# Patient Record
Sex: Female | Born: 1960 | Race: White | Hispanic: No | Marital: Married
Health system: Southern US, Community
[De-identification: ages and names within clinical notes are randomized; demographics above are authoritative.]

## PROBLEM LIST (undated history)

## (undated) DIAGNOSIS — F1020 Alcohol dependence, uncomplicated: Secondary | ICD-10-CM

---

## 2004-10-11 ENCOUNTER — Ambulatory Visit: Payer: Self-pay | Admitting: Unknown Physician Specialty

## 2005-07-29 ENCOUNTER — Other Ambulatory Visit: Payer: Self-pay

## 2005-07-29 ENCOUNTER — Inpatient Hospital Stay: Payer: Self-pay | Admitting: Internal Medicine

## 2005-07-31 ENCOUNTER — Inpatient Hospital Stay: Payer: Self-pay | Admitting: Unknown Physician Specialty

## 2006-12-09 ENCOUNTER — Ambulatory Visit: Payer: Self-pay | Admitting: Unknown Physician Specialty

## 2008-08-20 ENCOUNTER — Inpatient Hospital Stay: Payer: Self-pay | Admitting: Internal Medicine

## 2008-11-09 ENCOUNTER — Ambulatory Visit: Payer: Self-pay | Admitting: Unknown Physician Specialty

## 2010-06-05 ENCOUNTER — Emergency Department: Payer: Self-pay | Admitting: Emergency Medicine

## 2010-06-15 ENCOUNTER — Ambulatory Visit: Payer: Self-pay

## 2012-03-24 ENCOUNTER — Emergency Department: Payer: Self-pay | Admitting: Emergency Medicine

## 2012-03-24 LAB — COMPREHENSIVE METABOLIC PANEL
Albumin: 3.7 g/dL (ref 3.4–5.0)
Alkaline Phosphatase: 122 U/L (ref 50–136)
Anion Gap: 10 (ref 7–16)
BUN: 9 mg/dL (ref 7–18)
Bilirubin,Total: 1.3 mg/dL — ABNORMAL HIGH (ref 0.2–1.0)
Calcium, Total: 9.4 mg/dL (ref 8.5–10.1)
Chloride: 104 mmol/L (ref 98–107)
Co2: 25 mmol/L (ref 21–32)
Creatinine: 0.58 mg/dL — ABNORMAL LOW (ref 0.60–1.30)
EGFR (African American): >60
EGFR (Non-African Amer.): >60
Glucose: 108 mg/dL — ABNORMAL HIGH (ref 65–99)
Osmolality: 277 (ref 275–301)
Potassium: 4.2 mmol/L (ref 3.5–5.1)
SGOT(AST): 159 U/L — ABNORMAL HIGH (ref 15–37)
SGPT (ALT): 151 U/L — ABNORMAL HIGH (ref 12–78)
Sodium: 139 mmol/L (ref 136–145)
Total Protein: 8.3 g/dL — ABNORMAL HIGH (ref 6.4–8.2)

## 2012-03-24 LAB — CBC
HCT: 44.9 %
HGB: 15.6 g/dL
MCH: 37.2 pg — ABNORMAL HIGH
MCHC: 34.8 g/dL
MCV: 107 fL — ABNORMAL HIGH
Platelet: 155 x10 3/mm 3
RBC: 4.2 X10 6/mm 3
RDW: 12.4 %
WBC: 7.2 x10 3/mm 3

## 2017-10-06 ENCOUNTER — Emergency Department: Payer: BC Managed Care – PPO

## 2017-10-06 ENCOUNTER — Inpatient Hospital Stay
Admission: EM | Admit: 2017-10-06 | Discharge: 2017-10-15 | DRG: 871 | Disposition: E | Payer: BC Managed Care – PPO | Attending: Internal Medicine | Admitting: Internal Medicine

## 2017-10-06 ENCOUNTER — Other Ambulatory Visit: Payer: Self-pay

## 2017-10-06 DIAGNOSIS — A419 Sepsis, unspecified organism: Secondary | ICD-10-CM | POA: Diagnosis present

## 2017-10-06 DIAGNOSIS — D7589 Other specified diseases of blood and blood-forming organs: Secondary | ICD-10-CM | POA: Diagnosis present

## 2017-10-06 DIAGNOSIS — R6521 Severe sepsis with septic shock: Secondary | ICD-10-CM | POA: Diagnosis present

## 2017-10-06 DIAGNOSIS — E872 Acidosis: Secondary | ICD-10-CM | POA: Diagnosis present

## 2017-10-06 DIAGNOSIS — I471 Supraventricular tachycardia: Secondary | ICD-10-CM | POA: Diagnosis present

## 2017-10-06 DIAGNOSIS — J80 Acute respiratory distress syndrome: Secondary | ICD-10-CM | POA: Diagnosis not present

## 2017-10-06 DIAGNOSIS — N170 Acute kidney failure with tubular necrosis: Secondary | ICD-10-CM | POA: Diagnosis present

## 2017-10-06 DIAGNOSIS — E162 Hypoglycemia, unspecified: Secondary | ICD-10-CM | POA: Diagnosis present

## 2017-10-06 DIAGNOSIS — Z7189 Other specified counseling: Secondary | ICD-10-CM | POA: Diagnosis not present

## 2017-10-06 DIAGNOSIS — F10239 Alcohol dependence with withdrawal, unspecified: Secondary | ICD-10-CM | POA: Diagnosis present

## 2017-10-06 DIAGNOSIS — R4182 Altered mental status, unspecified: Secondary | ICD-10-CM

## 2017-10-06 DIAGNOSIS — D684 Acquired coagulation factor deficiency: Secondary | ICD-10-CM | POA: Diagnosis present

## 2017-10-06 DIAGNOSIS — I248 Other forms of acute ischemic heart disease: Secondary | ICD-10-CM | POA: Diagnosis present

## 2017-10-06 DIAGNOSIS — J189 Pneumonia, unspecified organism: Secondary | ICD-10-CM | POA: Diagnosis not present

## 2017-10-06 DIAGNOSIS — R402431 Glasgow coma scale score 3-8, in the field [EMT or ambulance]: Secondary | ICD-10-CM | POA: Diagnosis not present

## 2017-10-06 DIAGNOSIS — D696 Thrombocytopenia, unspecified: Secondary | ICD-10-CM | POA: Diagnosis present

## 2017-10-06 DIAGNOSIS — A4101 Sepsis due to Methicillin susceptible Staphylococcus aureus: Principal | ICD-10-CM | POA: Diagnosis present

## 2017-10-06 DIAGNOSIS — J9601 Acute respiratory failure with hypoxia: Secondary | ICD-10-CM | POA: Diagnosis present

## 2017-10-06 DIAGNOSIS — K7040 Alcoholic hepatic failure without coma: Secondary | ICD-10-CM | POA: Diagnosis present

## 2017-10-06 DIAGNOSIS — K7290 Hepatic failure, unspecified without coma: Secondary | ICD-10-CM

## 2017-10-06 DIAGNOSIS — Z452 Encounter for adjustment and management of vascular access device: Secondary | ICD-10-CM

## 2017-10-06 DIAGNOSIS — R6520 Severe sepsis without septic shock: Secondary | ICD-10-CM

## 2017-10-06 DIAGNOSIS — R570 Cardiogenic shock: Secondary | ICD-10-CM | POA: Diagnosis present

## 2017-10-06 DIAGNOSIS — I878 Other specified disorders of veins: Secondary | ICD-10-CM | POA: Diagnosis present

## 2017-10-06 DIAGNOSIS — R739 Hyperglycemia, unspecified: Secondary | ICD-10-CM | POA: Diagnosis present

## 2017-10-06 DIAGNOSIS — D689 Coagulation defect, unspecified: Secondary | ICD-10-CM | POA: Diagnosis present

## 2017-10-06 DIAGNOSIS — F10939 Alcohol use, unspecified with withdrawal, unspecified: Secondary | ICD-10-CM | POA: Diagnosis present

## 2017-10-06 DIAGNOSIS — N390 Urinary tract infection, site not specified: Secondary | ICD-10-CM | POA: Diagnosis present

## 2017-10-06 DIAGNOSIS — L8990 Pressure ulcer of unspecified site, unspecified stage: Secondary | ICD-10-CM

## 2017-10-06 DIAGNOSIS — D539 Nutritional anemia, unspecified: Secondary | ICD-10-CM | POA: Diagnosis present

## 2017-10-06 DIAGNOSIS — J9600 Acute respiratory failure, unspecified whether with hypoxia or hypercapnia: Secondary | ICD-10-CM

## 2017-10-06 DIAGNOSIS — R001 Bradycardia, unspecified: Secondary | ICD-10-CM | POA: Diagnosis present

## 2017-10-06 DIAGNOSIS — Z66 Do not resuscitate: Secondary | ICD-10-CM | POA: Diagnosis not present

## 2017-10-06 DIAGNOSIS — R4020 Unspecified coma: Secondary | ICD-10-CM | POA: Diagnosis present

## 2017-10-06 DIAGNOSIS — R7881 Bacteremia: Secondary | ICD-10-CM | POA: Diagnosis not present

## 2017-10-06 DIAGNOSIS — R778 Other specified abnormalities of plasma proteins: Secondary | ICD-10-CM | POA: Diagnosis present

## 2017-10-06 DIAGNOSIS — J690 Pneumonitis due to inhalation of food and vomit: Secondary | ICD-10-CM | POA: Diagnosis present

## 2017-10-06 DIAGNOSIS — J9690 Respiratory failure, unspecified, unspecified whether with hypoxia or hypercapnia: Secondary | ICD-10-CM

## 2017-10-06 DIAGNOSIS — R7989 Other specified abnormal findings of blood chemistry: Secondary | ICD-10-CM | POA: Diagnosis present

## 2017-10-06 HISTORY — DX: Alcohol dependence, uncomplicated: F10.20

## 2017-10-06 LAB — PROTIME-INR
INR: 3.67
PROTHROMBIN TIME: 36.2 s — AB (ref 11.4–15.2)

## 2017-10-06 LAB — CBC WITH DIFFERENTIAL/PLATELET
Basophils Absolute: 0 10*3/uL (ref 0–0.1)
Basophils Relative: 0 %
EOS PCT: 0 %
Eosinophils Absolute: 0 10*3/uL (ref 0–0.7)
HCT: 38 % (ref 35.0–47.0)
Hemoglobin: 12.8 g/dL (ref 12.0–16.0)
LYMPHS ABS: 0.6 10*3/uL — AB (ref 1.0–3.6)
LYMPHS PCT: 5 %
MCH: 40.2 pg — AB (ref 26.0–34.0)
MCHC: 33.8 g/dL (ref 32.0–36.0)
MCV: 119 fL — AB (ref 80.0–100.0)
MONO ABS: 1.5 10*3/uL — AB (ref 0.2–0.9)
Monocytes Relative: 14 %
Neutro Abs: 9.2 10*3/uL — ABNORMAL HIGH (ref 1.4–6.5)
Neutrophils Relative %: 81 %
PLATELETS: 112 10*3/uL — AB (ref 150–440)
RBC: 3.19 MIL/uL — ABNORMAL LOW (ref 3.80–5.20)
RDW: 16.8 % — AB (ref 11.5–14.5)
WBC: 11.3 10*3/uL — ABNORMAL HIGH (ref 3.6–11.0)

## 2017-10-06 LAB — BLOOD GAS, VENOUS
ACID-BASE DEFICIT: 17.3 mmol/L — AB (ref 0.0–2.0)
BICARBONATE: 11.8 mmol/L — AB (ref 20.0–28.0)
FIO2: 50
LHR: 16 {breaths}/min
O2 Saturation: 63.6 %
PCO2 VEN: 39 mmHg — AB (ref 44.0–60.0)
PEEP: 5 cmH2O
Patient temperature: 37
VT: 450 mL
pH, Ven: 7.09 — CL (ref 7.250–7.430)
pO2, Ven: 47 mmHg — ABNORMAL HIGH (ref 32.0–45.0)

## 2017-10-06 LAB — LIPASE, BLOOD: LIPASE: 46 U/L (ref 11–51)

## 2017-10-06 LAB — LACTIC ACID, PLASMA
LACTIC ACID, VENOUS: 11.3 mmol/L — AB (ref 0.5–1.9)
LACTIC ACID, VENOUS: 8.9 mmol/L — AB (ref 0.5–1.9)

## 2017-10-06 LAB — COMPREHENSIVE METABOLIC PANEL
ALT: 69 U/L — ABNORMAL HIGH (ref 14–54)
AST: 208 U/L — ABNORMAL HIGH (ref 15–41)
Albumin: 2.4 g/dL — ABNORMAL LOW (ref 3.5–5.0)
Alkaline Phosphatase: 78 U/L (ref 38–126)
Anion gap: 20 — ABNORMAL HIGH (ref 5–15)
BUN: 34 mg/dL — ABNORMAL HIGH (ref 6–20)
CO2: 13 mmol/L — ABNORMAL LOW (ref 22–32)
CREATININE: 1.38 mg/dL — AB (ref 0.44–1.00)
Calcium: 8.2 mg/dL — ABNORMAL LOW (ref 8.9–10.3)
Chloride: 104 mmol/L (ref 101–111)
GFR, EST AFRICAN AMERICAN: 49 mL/min — AB (ref >60)
GFR, EST NON AFRICAN AMERICAN: 42 mL/min — AB (ref >60)
Glucose, Bld: 98 mg/dL (ref 65–99)
POTASSIUM: 3.7 mmol/L (ref 3.5–5.1)
Sodium: 137 mmol/L (ref 135–145)
Total Bilirubin: 10.7 mg/dL — ABNORMAL HIGH (ref 0.3–1.2)
Total Protein: 7.1 g/dL (ref 6.5–8.1)

## 2017-10-06 LAB — ACETAMINOPHEN LEVEL: Acetaminophen (Tylenol), Serum: <10 ug/mL — ABNORMAL LOW (ref 10–30)

## 2017-10-06 LAB — ETHANOL: Alcohol, Ethyl (B): <10 mg/dL (ref <10)

## 2017-10-06 LAB — SALICYLATE LEVEL: Salicylate Lvl: 9.8 mg/dL (ref 2.8–30.0)

## 2017-10-06 LAB — TROPONIN I: Troponin I: 0.45 ng/mL (ref <0.03)

## 2017-10-06 MED ORDER — MIDAZOLAM HCL 5 MG/5ML IJ SOLN
4.0000 mg | Freq: Once | INTRAMUSCULAR | Status: AC
  Administered 2017-10-06: 4 mg via INTRAVENOUS

## 2017-10-06 MED ORDER — SODIUM CHLORIDE 0.9 % IV BOLUS
1000.0000 mL | Freq: Once | INTRAVENOUS | Status: AC
  Administered 2017-10-06: 1000 mL via INTRAVENOUS

## 2017-10-06 MED ORDER — PROPOFOL 1000 MG/100ML IV EMUL
INTRAVENOUS | Status: AC
  Administered 2017-10-06: 5 ug/kg/min via INTRAVENOUS
  Filled 2017-10-06: qty 100

## 2017-10-06 MED ORDER — VANCOMYCIN HCL IN DEXTROSE 1-5 GM/200ML-% IV SOLN
1000.0000 mg | Freq: Once | INTRAVENOUS | Status: AC
  Administered 2017-10-06: 1000 mg via INTRAVENOUS
  Filled 2017-10-06: qty 200

## 2017-10-06 MED ORDER — SODIUM CHLORIDE 0.9 % IV SOLN
0.0000 ug/h | INTRAVENOUS | Status: DC
  Administered 2017-10-06: 25 ug/h via INTRAVENOUS

## 2017-10-06 MED ORDER — SUCCINYLCHOLINE CHLORIDE 20 MG/ML IJ SOLN
100.0000 mg | Freq: Once | INTRAMUSCULAR | Status: AC
  Administered 2017-10-06: 100 mg via INTRAVENOUS

## 2017-10-06 MED ORDER — PROPOFOL 10 MG/ML IV BOLUS
40.0000 mg | Freq: Once | INTRAVENOUS | Status: AC
  Administered 2017-10-06: 40 mg via INTRAVENOUS

## 2017-10-06 MED ORDER — FENTANYL 2500MCG IN NS 250ML (10MCG/ML) PREMIX INFUSION
0.0000 ug/h | INTRAVENOUS | Status: DC
Start: 2017-10-06 — End: 2017-10-07
  Filled 2017-10-06 (×2): qty 250

## 2017-10-06 MED ORDER — FENTANYL CITRATE (PF) 100 MCG/2ML IJ SOLN
100.0000 ug | Freq: Once | INTRAMUSCULAR | Status: AC
  Administered 2017-10-06: 100 ug via INTRAVENOUS

## 2017-10-06 MED ORDER — PIPERACILLIN-TAZOBACTAM 3.375 G IVPB 30 MIN
3.3750 g | Freq: Once | INTRAVENOUS | Status: AC
  Administered 2017-10-06: 3.375 g via INTRAVENOUS
  Filled 2017-10-06: qty 50

## 2017-10-06 MED ORDER — ETOMIDATE 2 MG/ML IV SOLN
20.0000 mg | Freq: Once | INTRAVENOUS | Status: AC
  Administered 2017-10-06: 20 mg via INTRAVENOUS

## 2017-10-06 MED ORDER — SODIUM CHLORIDE 0.9 % IV SOLN
8.0000 mg/h | INTRAVENOUS | Status: DC
  Administered 2017-10-07 – 2017-10-08 (×4): 8 mg/h via INTRAVENOUS
  Filled 2017-10-06 (×4): qty 80

## 2017-10-06 MED ORDER — PROPOFOL 1000 MG/100ML IV EMUL
5.0000 ug/kg/min | Freq: Once | INTRAVENOUS | Status: AC
  Administered 2017-10-06: 5 ug/kg/min via INTRAVENOUS

## 2017-10-06 MED ORDER — SODIUM CHLORIDE 0.9 % IV SOLN
1.0000 mg/h | INTRAVENOUS | Status: DC
  Administered 2017-10-06: 1 mg/h via INTRAVENOUS
  Filled 2017-10-06: qty 10

## 2017-10-06 MED ORDER — SODIUM CHLORIDE 0.9 % IV SOLN
80.0000 mg | Freq: Once | INTRAVENOUS | Status: AC
  Administered 2017-10-07: 80 mg via INTRAVENOUS
  Filled 2017-10-06: qty 80

## 2017-10-06 MED ORDER — PANTOPRAZOLE SODIUM 40 MG IV SOLR: 40.0000 mg | Freq: Two times a day (BID) | INTRAVENOUS | Status: DC

## 2017-10-06 NOTE — ED Triage Notes (Signed)
Pt arrived via EMS from home with complaints of unresponsiveness. Pt is responsive at this time. EMS was called by a friend. Pt was found face down on the floor at her home. Pt was cyanotic, snoring, and cold. EMS placed a nose trumpet. Pt has Hx of alcohol. No one was able to give a Hx for the pt. Pt pupils were dilated. EMS gave Narcan and an amp of D50. BS was 47 and went up to 120 after D50. EMS stated that it looks like she may have "track" marks on her arms but they are not sure exactly what it is. Pt CO2-11. Pt has not been eating or drinking . EMS placed 20 in left hand and 20 in right hand. Pt is covered in feces. Pt is very agitated. Dr. Marisa SeverinSiadecki at bedside
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
## 2017-10-06 NOTE — ED Notes (Signed)
Date and time results received: 09/20/2017 2105  (use smartphrase ".now" to insert current time)  Test: Troponin Critical Value: 0.45  Name of Provider Notified: Dr. Marisa SeverinSiadecki  Orders Received? Or Actions Taken?:

## 2017-10-06 NOTE — ED Notes (Signed)
Date and time results received: 10/14/2017 @ 23:33  Test: Lactic Acid Critical Value: 8.9 mmol/L  Name of Provider Notified: Dr Marisa SeverinSiadecki  Orders Received? Or Actions Taken?: No new orders; continue to monitor

## 2017-10-06 NOTE — ED Provider Notes (Signed)
[facility] Emergency Department Provider Note ____________________________________________   First MD Initiated Contact with Patient 10/09/2017 2008     (approximate)  I have reviewed the triage vital signs and the nursing notes.   HISTORY  Chief Complaint Loss of Consciousness  History of present illness severely limited due to altered mental status  HPI Whitney Olson is a 57 y.o. female with no known past medical history (except for possible history of alcohol abuse per EMS) who was found unresponsive on the floor at her home.  Unknown when last seen normal.  On EMS arrival, the patient was found facedown, she was cyanotic and snoring.  EMS placed a nasal trumpet, found her to be hypoglycemic, and gave D50.  Patient herself is unable to provide any history.     History reviewed. No pertinent past medical history.  There are no active problems to display for this patient.   History reviewed. No pertinent surgical history.  Prior to Admission medications   Not on File    Allergies Patient has no allergy information on record.  History reviewed. No pertinent family history.  Social History Social History   Tobacco Use  . Smoking status: Unknown If Ever Smoked  . Smokeless tobacco: Never Used  Substance Use Topics  . Alcohol use: Yes  . Drug use: Yes    Review of Systems Level 5 caveat: Unable to obtain review of systems due to altered mental status    ____________________________________________   PHYSICAL EXAM:  VITAL SIGNS: ED Triage Vitals  Enc Vitals Group     BP 09/16/2017 1950 (!) 185/171     Pulse Rate 09/17/2017 1950 (!) 191     Resp 09/19/2017 1950 (!) 23     Temp --      Temp src --      SpO2 10/04/2017 1950 100 %     Weight 09/20/2017 1955 170 lb (77.1 kg)     Height 10/07/2017 1955 5\' 6"  (1.676 m)     Head Circumference --      Peak Flow --      Pain Score --      Pain Loc --      Pain Edu? --      Excl. in GC? --      Constitutional: Somnolent; grimaces and moans to sternal rub or other painful stimuli.  Not following commands. Eyes: Conjunctivae are normal.  Postdilated.  EOMI.  PERRLA. Head: Atraumatic. Nose: No congestion/rhinnorhea. Mouth/Throat: Mucous membranes are slightly dry.   Neck: Normal range of motion.  Cardiovascular: Tachycardic, irregular rhythm. Grossly normal heart sounds.  Good peripheral circulation.  No cyanosis.  Warm extremities. Respiratory: Somewhat depressed respiratory effort with coarse breath sounds and upper airway sounds.  No retractions. Lungs CTAB. Gastrointestinal: Soft and nontender. No distention.  Genitourinary: Normal external genitalia. Musculoskeletal: No lower extremity edema.  Extremities warm and well perfused.  Neurologic: Motor intact in all extremities.  Grimaces and moves purposely to painful stimuli. Skin:  Skin is warm and dry.  Possibly slightly jaundiced appearing.  No rash noted. Psychiatric: Unable to assess due to altered mental status  ____________________________________________   LABS (all labs ordered are listed, but only abnormal results are displayed)  Labs Reviewed  CBC WITH DIFFERENTIAL/PLATELET - Abnormal; Notable for the following components:      Result Value   WBC 11.3 (*)    RBC 3.19 (*)    MCV 119.0 (*)    MCH 40.2 (*)  RDW 16.8 (*)    Platelets 112 (*)    Neutro Abs 9.2 (*)    Lymphs Abs 0.6 (*)    Monocytes Absolute 1.5 (*)    All other components within normal limits  COMPREHENSIVE METABOLIC PANEL - Abnormal; Notable for the following components:   CO2 13 (*)    BUN 34 (*)    Creatinine, Ser 1.38 (*)    Calcium 8.2 (*)    Albumin 2.4 (*)    AST 208 (*)    ALT 69 (*)    Total Bilirubin 10.7 (*)    GFR calc non Af Amer 42 (*)    GFR calc Af Amer 49 (*)    Anion gap 20 (*)    All other components within normal limits  LACTIC ACID, PLASMA - Abnormal; Notable for the following components:   Lactic Acid,  Venous 11.3 (*)    All other components within normal limits  BLOOD GAS, VENOUS - Abnormal; Notable for the following components:   pH, Ven 7.09 (*)    pCO2, Ven 39 (*)    pO2, Ven 47.0 (*)    Bicarbonate 11.8 (*)    Acid-base deficit 17.3 (*)    All other components within normal limits  TROPONIN I - Abnormal; Notable for the following components:   Troponin I 0.45 (*)    All other components within normal limits  ACETAMINOPHEN LEVEL - Abnormal; Notable for the following components:   Acetaminophen (Tylenol), Serum <10 (*)    All other components within normal limits  PROTIME-INR - Abnormal; Notable for the following components:   Prothrombin Time 36.2 (*)    All other components within normal limits  CULTURE, BLOOD (ROUTINE X 2)  CULTURE, BLOOD (ROUTINE X 2)  SALICYLATE LEVEL  ETHANOL  LIPASE, BLOOD  LACTIC ACID, PLASMA  URINALYSIS, COMPLETE (UACMP) WITH MICROSCOPIC   ____________________________________________  EKG  ED ECG REPORT I, Dionne BucySebastian Jola Critzer, the attending physician, personally viewed and interpreted this ECG.  Date: 10/14/2017 EKG Time: 1943 Rate: 191 Rhythm: Irregular narrow complex tachycardia QRS Axis: Borderline right axis Intervals: normal ST/T Wave abnormalities: Nonspecific repolarization abnormality Narrative Interpretation: SVT versus rapid A. Fib  ED ECG REPORT I, Dionne BucySebastian Alexandre Faries, the attending physician, personally viewed and interpreted this ECG.  Date: 09/17/2017 EKG Time: 2000 Rate: 73 borderline right axis Rhythm: normal sinus rhythm QRS Axis: normal Intervals: normal ST/T Wave abnormalities: Anterolateral repolarization abnormality Narrative Interpretation: no evidence of acute ischemia     ____________________________________________  RADIOLOGY  CXR: Multifocal opacities consistent with pneumonia, and vascular congestion CT head: No ICH or other acute  findings  ____________________________________________   PROCEDURES  Procedure(s) performed: No  Procedure Name: Intubation Date/Time: 10/05/2017 9:04 PM Performed by: Dionne BucySiadecki, Immaculate Crutcher, MD Pre-anesthesia Checklist: Patient identified Oxygen Delivery Method: Nasal cannula Preoxygenation: Pre-oxygenation with 100% oxygen Induction Type: Rapid sequence Laryngoscope Size: Glidescope Grade View: Grade II Tube size: 7.0 mm Number of attempts: 2 Airway Equipment and Method: Patient positioned with wedge pillow Placement Confirmation: ETT inserted through vocal cords under direct vision,  Positive ETCO2 and Breath sounds checked- equal and bilateral Secured at: 25 cm Tube secured with: ETT holder Difficulty Due To: Difficult Airway- due to anterior larynx       Critical Care performed: Yes  CRITICAL CARE Performed by: Dionne BucySebastian Syre Knerr   Total critical care time: 60 minutes  Critical care time was exclusive of separately billable procedures and treating other patients.  Critical care was necessary to treat or prevent imminent  or life-threatening deterioration.  Critical care was time spent personally by me on the following activities: development of treatment plan with patient and/or surrogate as well as nursing, discussions with consultants, evaluation of patient's response to treatment, examination of patient, obtaining history from patient or surrogate, ordering and performing treatments and interventions, ordering and review of laboratory studies, ordering and review of radiographic studies, pulse oximetry and re-evaluation of patient's condition. ____________________________________________   INITIAL IMPRESSION / ASSESSMENT AND PLAN / ED COURSE  Pertinent labs & imaging results that were available during my care of the patient were reviewed by me and considered in my medical decision making (see chart for details).  57 year old female with unknown PMH is checked  for possible alcohol abuse presents after she was found by a friend on the floor obtunded.  The patient got Narcan and D50 in the field without improvement in her mental status.  She was cyanotic on EMS arrival.  On arrival to the ED, the patient was lethargic and difficult to arouse.  She had some purposeful movement and moaning to  sternal rub.  She had coarse breath and upper airway sounds and as she was more stimulated with [place] placing her on the monitor and moving her to the stretcher, she became agitated and and difficult to control.  On the monitor, the patient was significantly hypertensive, and her heart rate was a narrow complex tachycardia in the 190s.  The remainder of the exam is as described above.  Although the patient maintain O2 sat of 100%, given her agitation, severe lethargy, and concern for inability to protect her airway, she required intubation.  The patient was intubated successfully on the third attempt due to poor airway visualization and anterior anatomy, with the patient being easily ventilated by BVM in between attempts and with no hypoxia.  During the intubation, the heart rate converted to a normal sinus rhythm.  The differential diagnosis is very broad; I am considering substance abuse/intoxication, aspiration, other infectious source for possible sepsis, primary cardiac etiology causing altered mental status or hypoxia, respiratory etiology such as COPD with hypercapnia, or metabolic derangement.  Plan: Cardiac monitoring, lab work-up including toxicology labs, CT head, chest x-ray, and admission to the ICU.  Clinical Course as of Oct 07 2254  Mon 26-Oct-2017  2151 WBC(!): 11.3 [SS]    Clinical Course User Index [SS] Dionne Bucy, MD   ----------------------------------------- 10:54 PM on 10/26/2017 -----------------------------------------  Work-up reveals multiple abnormalities including possible multifocal pneumonia, elevated LFTs and lactate, and  elevated troponin.  Alcohol is not elevated at this time.  Patient has required multiple adjustments to her sedation regimen to keep her comfortable but maintain good BP, after developing some hypotension with the initial propofol infusion.  She is currently on fentanyl and Versed infusions.  Fluids and broad-spectrum antibiotics have been initiated.  I signed the patient out to the hospitalist Dr. Anne Hahn for admission to the ICU.  ____________________________________________   FINAL CLINICAL IMPRESSION(S) / ED DIAGNOSES  Final diagnoses:  Altered mental status, unspecified altered mental status type  Acute respiratory failure, unspecified whether with hypoxia or hypercapnia (HCC)      NEW MEDICATIONS STARTED DURING THIS VISIT:  New Prescriptions   No medications on file     Note:  This document was prepared using Dragon voice recognition software and may include unintentional dictation errors.    Dionne Bucy, MD Oct 26, 2017 2257

## 2017-10-06 NOTE — ED Notes (Signed)
Date and time results received: 10/05/2017 2134 (use smartphrase ".now" to insert current time)  Test: Lactic Acid  Critical Value: 11.3  Name of Provider Notified: Dr. Marisa SeverinSiadecki  Orders Received? Or Actions Taken?:

## 2017-10-06 NOTE — Progress Notes (Signed)
Pt transported to ct scan  Via vent , and back to room 8 without any incident , no signs resp disress noted

## 2017-10-07 ENCOUNTER — Inpatient Hospital Stay (HOSPITAL_COMMUNITY)
Admit: 2017-10-07 | Discharge: 2017-10-07 | Disposition: A | Discharge status: Home / Self Care | Payer: BC Managed Care – PPO | Attending: Pulmonary Disease | Admitting: Pulmonary Disease

## 2017-10-07 ENCOUNTER — Inpatient Hospital Stay: Payer: BC Managed Care – PPO

## 2017-10-07 DIAGNOSIS — N390 Urinary tract infection, site not specified: Secondary | ICD-10-CM | POA: Diagnosis present

## 2017-10-07 DIAGNOSIS — R4182 Altered mental status, unspecified: Secondary | ICD-10-CM

## 2017-10-07 DIAGNOSIS — L8990 Pressure ulcer of unspecified site, unspecified stage: Secondary | ICD-10-CM

## 2017-10-07 DIAGNOSIS — R6521 Severe sepsis with septic shock: Secondary | ICD-10-CM

## 2017-10-07 DIAGNOSIS — J9601 Acute respiratory failure with hypoxia: Secondary | ICD-10-CM

## 2017-10-07 DIAGNOSIS — J189 Pneumonia, unspecified organism: Secondary | ICD-10-CM

## 2017-10-07 DIAGNOSIS — R7881 Bacteremia: Secondary | ICD-10-CM

## 2017-10-07 DIAGNOSIS — R7989 Other specified abnormal findings of blood chemistry: Secondary | ICD-10-CM

## 2017-10-07 DIAGNOSIS — A419 Sepsis, unspecified organism: Secondary | ICD-10-CM

## 2017-10-07 DIAGNOSIS — R402431 Glasgow coma scale score 3-8, in the field [EMT or ambulance]: Secondary | ICD-10-CM

## 2017-10-07 DIAGNOSIS — R6520 Severe sepsis without septic shock: Secondary | ICD-10-CM

## 2017-10-07 LAB — RENAL FUNCTION PANEL
Albumin: 1.7 g/dL — ABNORMAL LOW (ref 3.5–5.0)
Anion gap: 13 (ref 5–15)
BUN: 34 mg/dL — AB (ref 6–20)
CHLORIDE: 105 mmol/L (ref 101–111)
CO2: 22 mmol/L (ref 22–32)
CREATININE: 2.63 mg/dL — AB (ref 0.44–1.00)
Calcium: 6.1 mg/dL — CL (ref 8.9–10.3)
GFR calc Af Amer: 22 mL/min — ABNORMAL LOW (ref >60)
GFR, EST NON AFRICAN AMERICAN: 19 mL/min — AB (ref >60)
GLUCOSE: 146 mg/dL — AB (ref 65–99)
Phosphorus: 9.8 mg/dL — ABNORMAL HIGH (ref 2.5–4.6)
Potassium: 4.2 mmol/L (ref 3.5–5.1)
Sodium: 140 mmol/L (ref 135–145)

## 2017-10-07 LAB — URINALYSIS, COMPLETE (UACMP) WITH MICROSCOPIC
Glucose, UA: 50 mg/dL — AB
KETONES UR: NEGATIVE mg/dL
Leukocytes, UA: NEGATIVE
Nitrite: POSITIVE — AB
PH: 5 (ref 5.0–8.0)
Protein, ur: 100 mg/dL — AB
Specific Gravity, Urine: 1.016 (ref 1.005–1.030)

## 2017-10-07 LAB — CK: CK TOTAL: 993 U/L — AB (ref 38–234)

## 2017-10-07 LAB — BASIC METABOLIC PANEL
ANION GAP: 16 — AB (ref 5–15)
BUN: 32 mg/dL — ABNORMAL HIGH (ref 6–20)
CO2: 15 mmol/L — ABNORMAL LOW (ref 22–32)
Calcium: 6.8 mg/dL — ABNORMAL LOW (ref 8.9–10.3)
Chloride: 110 mmol/L (ref 101–111)
Creatinine, Ser: 1.75 mg/dL — ABNORMAL HIGH (ref 0.44–1.00)
GFR calc Af Amer: 37 mL/min — ABNORMAL LOW (ref >60)
GFR calc non Af Amer: 32 mL/min — ABNORMAL LOW (ref >60)
GLUCOSE: 66 mg/dL (ref 65–99)
POTASSIUM: 3.5 mmol/L (ref 3.5–5.1)
Sodium: 141 mmol/L (ref 135–145)

## 2017-10-07 LAB — BLOOD GAS, ARTERIAL
ACID-BASE DEFICIT: 17.5 mmol/L — AB (ref 0.0–2.0)
BICARBONATE: 9.5 mmol/L — AB (ref 20.0–28.0)
FIO2: 0.5
LHR: 16 {breaths}/min
O2 Saturation: 98.7 %
PEEP/CPAP: 5 cmH2O
PH ART: 7.19 — AB (ref 7.350–7.450)
Patient temperature: 35.8
VT: 450 mL
pCO2 arterial: 26 mmHg — ABNORMAL LOW (ref 32.0–48.0)
pO2, Arterial: 141 mmHg — ABNORMAL HIGH (ref 83.0–108.0)

## 2017-10-07 LAB — BLOOD CULTURE ID PANEL (REFLEXED)
Acinetobacter baumannii: NOT DETECTED
CANDIDA ALBICANS: NOT DETECTED
CANDIDA GLABRATA: NOT DETECTED
CANDIDA KRUSEI: NOT DETECTED
Candida parapsilosis: NOT DETECTED
Candida tropicalis: NOT DETECTED
ENTEROBACTER CLOACAE COMPLEX: NOT DETECTED
ENTEROBACTERIACEAE SPECIES: NOT DETECTED
ESCHERICHIA COLI: NOT DETECTED
Enterococcus species: NOT DETECTED
Haemophilus influenzae: NOT DETECTED
KLEBSIELLA PNEUMONIAE: NOT DETECTED
Klebsiella oxytoca: NOT DETECTED
Listeria monocytogenes: NOT DETECTED
Methicillin resistance: NOT DETECTED
Neisseria meningitidis: NOT DETECTED
Proteus species: NOT DETECTED
Pseudomonas aeruginosa: NOT DETECTED
STREPTOCOCCUS AGALACTIAE: NOT DETECTED
STREPTOCOCCUS PYOGENES: NOT DETECTED
STREPTOCOCCUS SPECIES: NOT DETECTED
Serratia marcescens: NOT DETECTED
Staphylococcus aureus (BCID): DETECTED — AB
Staphylococcus species: DETECTED — AB
Streptococcus pneumoniae: NOT DETECTED

## 2017-10-07 LAB — URINE DRUG SCREEN, QUALITATIVE (ARMC ONLY)
AMPHETAMINES, UR SCREEN: NOT DETECTED
BENZODIAZEPINE, UR SCRN: NOT DETECTED
Barbiturates, Ur Screen: NOT DETECTED
Cannabinoid 50 Ng, Ur ~~LOC~~: NOT DETECTED
Cocaine Metabolite,Ur ~~LOC~~: NOT DETECTED
MDMA (Ecstasy)Ur Screen: NOT DETECTED
METHADONE SCREEN, URINE: NOT DETECTED
OPIATE, UR SCREEN: NOT DETECTED
Phencyclidine (PCP) Ur S: NOT DETECTED
TRICYCLIC, UR SCREEN: NOT DETECTED

## 2017-10-07 LAB — CBC
HCT: 30.5 % — ABNORMAL LOW (ref 35.0–47.0)
Hemoglobin: 10.2 g/dL — ABNORMAL LOW (ref 12.0–16.0)
MCH: 39.9 pg — ABNORMAL HIGH (ref 26.0–34.0)
MCHC: 33.6 g/dL (ref 32.0–36.0)
MCV: 118.8 fL — AB (ref 80.0–100.0)
Platelets: 89 10*3/uL — ABNORMAL LOW (ref 150–440)
RBC: 2.56 MIL/uL — AB (ref 3.80–5.20)
RDW: 16.9 % — ABNORMAL HIGH (ref 11.5–14.5)
WBC: 12.4 10*3/uL — AB (ref 3.6–11.0)

## 2017-10-07 LAB — GLUCOSE, CAPILLARY
GLUCOSE-CAPILLARY: 54 mg/dL — AB (ref 65–99)
GLUCOSE-CAPILLARY: 65 mg/dL (ref 65–99)
GLUCOSE-CAPILLARY: 70 mg/dL (ref 65–99)
GLUCOSE-CAPILLARY: 76 mg/dL (ref 65–99)
Glucose-Capillary: 93 mg/dL (ref 65–99)
Glucose-Capillary: 79 mg/dL (ref 65–99)
Glucose-Capillary: 73 mg/dL (ref 65–99)
Glucose-Capillary: 106 mg/dL — ABNORMAL HIGH (ref 65–99)
Glucose-Capillary: 96 mg/dL (ref 65–99)
Glucose-Capillary: 118 mg/dL — ABNORMAL HIGH (ref 65–99)

## 2017-10-07 LAB — MAGNESIUM: MAGNESIUM: 1.4 mg/dL — AB (ref 1.7–2.4)

## 2017-10-07 LAB — FIBRIN DERIVATIVES D-DIMER (ARMC ONLY): Fibrin derivatives D-dimer (ARMC): 4779.04 ng/mL (FEU) — ABNORMAL HIGH (ref 0.00–499.00)

## 2017-10-07 LAB — LACTIC ACID, PLASMA
LACTIC ACID, VENOUS: 10.7 mmol/L — AB (ref 0.5–1.9)
Lactic Acid, Venous: 10.7 mmol/L (ref 0.5–1.9)

## 2017-10-07 LAB — TROPONIN I
TROPONIN I: 0.54 ng/mL — AB (ref <0.03)
TROPONIN I: 1.24 ng/mL — AB (ref <0.03)
Troponin I: 0.67 ng/mL (ref <0.03)
Troponin I: 1.89 ng/mL (ref <0.03)

## 2017-10-07 LAB — AMMONIA: AMMONIA: 112 umol/L — AB (ref 9–35)

## 2017-10-07 LAB — FIBRINOGEN: FIBRINOGEN: 184 mg/dL — AB (ref 210–475)

## 2017-10-07 LAB — AMYLASE: Amylase: 139 U/L — ABNORMAL HIGH (ref 28–100)

## 2017-10-07 LAB — PROCALCITONIN: Procalcitonin: 0.48 ng/mL

## 2017-10-07 LAB — ECHOCARDIOGRAM COMPLETE
Height: 65 in
Weight: 2931.24 oz

## 2017-10-07 LAB — MRSA PCR SCREENING: MRSA BY PCR: NEGATIVE

## 2017-10-07 MED ORDER — SODIUM CHLORIDE 0.9 % IV SOLN: 250.0000 mL | INTRAVENOUS | Status: DC | PRN

## 2017-10-07 MED ORDER — FENTANYL 2500MCG IN NS 250ML (10MCG/ML) PREMIX INFUSION
25.0000 ug/h | INTRAVENOUS | Status: DC
  Administered 2017-10-07: 300 ug/h via INTRAVENOUS
  Administered 2017-10-07: 400 ug/h via INTRAVENOUS
  Filled 2017-10-07: qty 250

## 2017-10-07 MED ORDER — MIDAZOLAM HCL 2 MG/2ML IJ SOLN: 2.0000 mg | INTRAMUSCULAR | Status: DC | PRN

## 2017-10-07 MED ORDER — HYDROCORTISONE NA SUCCINATE PF 100 MG IJ SOLR
50.0000 mg | Freq: Four times a day (QID) | INTRAMUSCULAR | Status: DC
  Administered 2017-10-07 – 2017-10-08 (×5): 50 mg via INTRAVENOUS
  Filled 2017-10-07 (×5): qty 2

## 2017-10-07 MED ORDER — SODIUM BICARBONATE 8.4 % IV SOLN
INTRAVENOUS | Status: DC
  Administered 2017-10-07 – 2017-10-08 (×5): via INTRAVENOUS
  Filled 2017-10-07 (×9): qty 150

## 2017-10-07 MED ORDER — FAMOTIDINE IN NACL 20-0.9 MG/50ML-% IV SOLN
20.0000 mg | Freq: Two times a day (BID) | INTRAVENOUS | Status: DC
  Administered 2017-10-07: 20 mg via INTRAVENOUS
  Filled 2017-10-07: qty 50

## 2017-10-07 MED ORDER — SODIUM BICARBONATE 8.4 % IV SOLN
150.0000 meq | Freq: Once | INTRAVENOUS | Status: AC
  Administered 2017-10-07: 150 meq via INTRAVENOUS

## 2017-10-07 MED ORDER — FENTANYL BOLUS VIA INFUSION
50.0000 ug | INTRAVENOUS | Status: DC | PRN
  Filled 2017-10-07: qty 50

## 2017-10-07 MED ORDER — DEXTROSE 50 % IV SOLN
50.0000 mL | Freq: Once | INTRAVENOUS | Status: AC
  Administered 2017-10-07: 50 mL via INTRAVENOUS
  Filled 2017-10-07: qty 50

## 2017-10-07 MED ORDER — ASPIRIN 300 MG RE SUPP: 300.0000 mg | RECTAL | Status: DC

## 2017-10-07 MED ORDER — HEPARIN SODIUM (PORCINE) 1000 UNIT/ML DIALYSIS
1000.0000 [IU] | INTRAMUSCULAR | Status: DC | PRN
  Filled 2017-10-07: qty 6

## 2017-10-07 MED ORDER — PIPERACILLIN-TAZOBACTAM 3.375 G IVPB
3.3750 g | Freq: Three times a day (TID) | INTRAVENOUS | Status: DC
  Administered 2017-10-07: 3.375 g via INTRAVENOUS
  Filled 2017-10-07: qty 50

## 2017-10-07 MED ORDER — SODIUM CHLORIDE 0.9 % IV SOLN
INTRAVENOUS | Status: DC
  Administered 2017-10-07: 150 mL via INTRAVENOUS

## 2017-10-07 MED ORDER — BISACODYL 10 MG RE SUPP: 10.0000 mg | Freq: Every day | RECTAL | Status: DC | PRN

## 2017-10-07 MED ORDER — VASOPRESSIN 20 UNIT/ML IV SOLN
0.0300 [IU]/min | INTRAVENOUS | Status: DC
  Administered 2017-10-07: 0.03 [IU]/min via INTRAVENOUS
  Filled 2017-10-07 (×2): qty 2

## 2017-10-07 MED ORDER — SODIUM CHLORIDE 0.9 % IV BOLUS
1000.0000 mL | Freq: Once | INTRAVENOUS | Status: AC
  Administered 2017-10-07: 1000 mL via INTRAVENOUS

## 2017-10-07 MED ORDER — ORAL CARE MOUTH RINSE
15.0000 mL | Freq: Four times a day (QID) | OROMUCOSAL | Status: DC
  Administered 2017-10-07 – 2017-10-08 (×4): 15 mL via OROMUCOSAL

## 2017-10-07 MED ORDER — SODIUM CHLORIDE 0.9 % IV SOLN: 750.0000 mL | INTRAVENOUS | Status: DC | PRN

## 2017-10-07 MED ORDER — DEXTROSE 50 % IV SOLN
INTRAVENOUS | Status: AC
  Administered 2017-10-07: 50 mL via INTRAVENOUS
  Filled 2017-10-07: qty 50

## 2017-10-07 MED ORDER — THIAMINE HCL 100 MG/ML IJ SOLN
500.0000 mg | INTRAVENOUS | Status: DC
  Administered 2017-10-07: 500 mg via INTRAVENOUS
  Filled 2017-10-07 (×2): qty 5

## 2017-10-07 MED ORDER — SENNOSIDES 8.8 MG/5ML PO SYRP
5.0000 mL | ORAL_SOLUTION | Freq: Two times a day (BID) | ORAL | Status: DC | PRN
  Filled 2017-10-07: qty 5

## 2017-10-07 MED ORDER — IPRATROPIUM-ALBUTEROL 0.5-2.5 (3) MG/3ML IN SOLN
3.0000 mL | Freq: Four times a day (QID) | RESPIRATORY_TRACT | Status: DC
  Administered 2017-10-07 – 2017-10-08 (×5): 3 mL via RESPIRATORY_TRACT
  Filled 2017-10-07 (×5): qty 3

## 2017-10-07 MED ORDER — DEXTROSE 5 % IV SOLN
0.0000 ug/min | INTRAVENOUS | Status: DC
  Administered 2017-10-07: 18 ug/min via INTRAVENOUS
  Administered 2017-10-07: 4 ug/min via INTRAVENOUS
  Filled 2017-10-07 (×3): qty 16

## 2017-10-07 MED ORDER — SODIUM CHLORIDE 0.9 % IV SOLN
3.0000 g | Freq: Four times a day (QID) | INTRAVENOUS | Status: DC
  Administered 2017-10-07 – 2017-10-08 (×2): 3 g via INTRAVENOUS
  Filled 2017-10-07 (×5): qty 3

## 2017-10-07 MED ORDER — DEXTROSE 50 % IV SOLN
50.0000 mL | Freq: Once | INTRAVENOUS | Status: AC
  Administered 2017-10-07: 50 mL via INTRAVENOUS

## 2017-10-07 MED ORDER — PUREFLOW DIALYSIS SOLUTION
INTRAVENOUS | Status: DC
  Administered 2017-10-07: 3 via INTRAVENOUS_CENTRAL

## 2017-10-07 MED ORDER — SODIUM CHLORIDE 0.9 % IV SOLN
2.0000 g | Freq: Every day | INTRAVENOUS | Status: DC
  Administered 2017-10-07: 2 g via INTRAVENOUS
  Filled 2017-10-07: qty 2

## 2017-10-07 MED ORDER — ONDANSETRON HCL 4 MG/2ML IJ SOLN: 4.0000 mg | Freq: Four times a day (QID) | INTRAMUSCULAR | Status: DC | PRN

## 2017-10-07 MED ORDER — VANCOMYCIN HCL 10 G IV SOLR
1250.0000 mg | INTRAVENOUS | Status: DC
  Administered 2017-10-07: 1250 mg via INTRAVENOUS
  Filled 2017-10-07: qty 1250

## 2017-10-07 MED ORDER — SODIUM CHLORIDE 0.9 % IV SOLN
1.0000 mg | Freq: Every day | INTRAVENOUS | Status: DC
  Administered 2017-10-07: 1 mg via INTRAVENOUS
  Filled 2017-10-07 (×2): qty 0.2

## 2017-10-07 MED ORDER — LACTULOSE 10 GM/15ML PO SOLN
30.0000 g | Freq: Two times a day (BID) | ORAL | Status: DC
  Administered 2017-10-07: 30 g via ORAL
  Filled 2017-10-07: qty 60

## 2017-10-07 MED ORDER — LACTULOSE 10 GM/15ML PO SOLN: 30.0000 g | Freq: Two times a day (BID) | ORAL | Status: DC | PRN

## 2017-10-07 MED ORDER — FENTANYL CITRATE (PF) 100 MCG/2ML IJ SOLN
50.0000 ug | Freq: Once | INTRAMUSCULAR | Status: AC
  Administered 2017-10-07: 50 ug via INTRAVENOUS

## 2017-10-07 MED ORDER — RIFAXIMIN 550 MG PO TABS
550.0000 mg | ORAL_TABLET | Freq: Two times a day (BID) | ORAL | Status: DC
  Administered 2017-10-07: 550 mg
  Filled 2017-10-07: qty 1

## 2017-10-07 MED ORDER — CHLORHEXIDINE GLUCONATE 0.12% ORAL RINSE (MEDLINE KIT)
15.0000 mL | Freq: Two times a day (BID) | OROMUCOSAL | Status: DC
  Administered 2017-10-07 (×2): 15 mL via OROMUCOSAL

## 2017-10-07 MED ORDER — LACTULOSE 10 GM/15ML PO SOLN
30.0000 g | Freq: Once | ORAL | Status: AC
  Administered 2017-10-07: 30 g
  Filled 2017-10-07: qty 60

## 2017-10-07 MED ORDER — CALCIUM GLUCONATE 10 % IV SOLN
1.0000 g | Freq: Once | INTRAVENOUS | Status: AC
  Administered 2017-10-08: 1 g via INTRAVENOUS
  Filled 2017-10-07: qty 10

## 2017-10-07 MED ORDER — ASPIRIN 81 MG PO CHEW: 324.0000 mg | CHEWABLE_TABLET | ORAL | Status: DC

## 2017-10-07 MED ORDER — MAGNESIUM SULFATE 4 GM/100ML IV SOLN
4.0000 g | Freq: Once | INTRAVENOUS | Status: AC
  Administered 2017-10-08: 4 g via INTRAVENOUS
  Filled 2017-10-07: qty 100

## 2017-10-07 MED ORDER — FAMOTIDINE IN NACL 20-0.9 MG/50ML-% IV SOLN: 20.0000 mg | INTRAVENOUS | Status: DC

## 2017-10-07 NOTE — Consult Note (Signed)
[facility] Infectious Disease     Reason for Consult:MSSA bacteremia, sepsis, PNA    Referring Physician: Alva Garnet, D Date of Admission:  09/23/2017   Principal Problem:   Acute respiratory failure with hypoxia (HCC) Active Problems:   Severe sepsis (HCC)   Multifocal pneumonia   Alcohol withdrawal (HCC)   Elevated troponin   Coagulopathy (HCC)   UTI (urinary tract infection)   Altered mental status   HPI: Whitney Olson is a 57 y.o. female admitted after being found unresponsive and hypotensive.  She was found to have an extremely low blood glucose.  She was intubated in the ED.  Does have a history of heavy alcohol use and reportedly had not been feeling well for a few days and had not been drinking.  Her husband has been recently admitted as he was diagnosed with a stroke. Since admission she has had a fever and blood cultures have turned positive for methicillin sensitive staph aureus.  She was started initially on IV vancomycin and Zosyn.  On admission her white count was 11.3 and platelets 112.  She has profound macrocytosis.  INR was elevated at 3.67.  Urinalysis was negative alcohol level was negative tox screen was negative.  She has had imaging including chest x-ray on admission which showed multifocal airspace disease in the upper lobes and left base concerning for multifocal pneumonia as well as cardiomegaly with vascular congestion.  X-ray of her abdomen suggest an ileus.  CT of the head was negative for acute findings.   Past Medical History:  Diagnosis Date  . Alcoholism ([place])    History reviewed. No pertinent surgical history. Social History   Tobacco Use  . Smoking status: Unknown If Ever Smoked  . Smokeless tobacco: Never Used  Substance Use Topics  . Alcohol use: Yes  . Drug use: Yes   History reviewed. No pertinent family history.  Allergies: Not on File  Current antibiotics: Antibiotics Given (last 72 hours)    Date/Time Action Medication Dose Rate    09/30/2017 2212 New Bag/Given   piperacillin-tazobactam (ZOSYN) IVPB 3.375 g 3.375 g 100 mL/hr   10/11/2017 2242 New Bag/Given   vancomycin (VANCOCIN) IVPB 1000 mg/200 mL premix 1,000 mg 200 mL/hr   10/07/17 0646 New Bag/Given   vancomycin (VANCOCIN) 1,250 mg in sodium chloride 0.9 % 250 mL IVPB 1,250 mg 166.7 mL/hr   10/07/17 0837 New Bag/Given   piperacillin-tazobactam (ZOSYN) IVPB 3.375 g 3.375 g 12.5 mL/hr   10/07/17 1307 Given   rifaximin (XIFAXAN) tablet 550 mg 550 mg       MEDICATIONS: . aspirin  324 mg Oral NOW   Or  . aspirin  300 mg Rectal NOW  . chlorhexidine gluconate (MEDLINE KIT)  15 mL Mouth Rinse BID  . hydrocortisone sod succinate (SOLU-CORTEF) inj  50 mg Intravenous Q6H  . ipratropium-albuterol  3 mL Nebulization Q6H  . lactulose  30 g Oral BID  . mouth rinse  15 mL Mouth Rinse QID  . [START ON 10/10/2017] pantoprazole  40 mg Intravenous Q12H  . rifaximin  550 mg Per Tube BID    Review of Systems - unable to obtain  OBJECTIVE: Temp:  [94.9 F (34.9 C)-100 F (37.8 C)] 100 F (37.8 C) (04/23 1115) Pulse Rate:  [71-191] 113 (04/23 1115) Resp:  [14-31] 17 (04/23 1115) BP: (100-195)/(37-171) 122/48 (04/23 1115) SpO2:  [90 %-100 %] 94 % (04/23 1115) FiO2 (%):  [40 %-50 %] 40 % (04/23 1155) Weight:  [77.1 kg (  170 lb)-83.1 kg (183 lb 3.2 oz)] 83.1 kg (183 lb 3.2 oz) (04/23 0113) Physical Exam  Constitutional:  Intubated, sedated HENT: icteric, deeply Mouth/Throat: ETT tube in place Cardiovascular: hyperdynamic, 2/6 sm Pulmonary/Chest: mech breath sounds,  Neck = supple, no nuchal rigidity Abdominal: Soft. Bowel sounds are normal.  exhibits no distension. There is no tenderness.  LINES- R IJ line Lymphadenopathy: no cervical adenopathy. No axillary adenopathy Neurological: intubated, sedated Skin: bruising, some healing scabs but no open wounds or cellulitis  Psychiatric: unable to obtain  LABS: Results for orders placed or performed during the hospital  encounter of 10/13/2017 (from the past 48 hour(s))  Urinalysis, Complete w Microscopic     Status: Abnormal   Collection Time: 10/01/2017  8:10 PM  Result Value Ref Range   Color, Urine AMBER (A) YELLOW    Comment: BIOCHEMICALS MAY BE AFFECTED BY COLOR   APPearance CLOUDY (A) CLEAR   Specific Gravity, Urine 1.016 1.005 - 1.030   pH 5.0 5.0 - 8.0   Glucose, UA 50 (A) NEGATIVE mg/dL   Hgb urine dipstick LARGE (A) NEGATIVE   Bilirubin Urine MODERATE (A) NEGATIVE   Ketones, ur NEGATIVE NEGATIVE mg/dL   Protein, ur 100 (A) NEGATIVE mg/dL   Nitrite POSITIVE (A) NEGATIVE   Leukocytes, UA NEGATIVE NEGATIVE   RBC / HPF 6-30 0 - 5 RBC/hpf   WBC, UA 0-5 0 - 5 WBC/hpf   Bacteria, UA RARE (A) NONE SEEN   Squamous Epithelial / LPF 0-5 (A) NONE SEEN   Mucus PRESENT    Hyaline Casts, UA PRESENT     Comment: Performed at [facility], [address] [place], [place] [address]  Blood gas, venous     Status: Abnormal   Collection Time: 10/05/2017  8:10 PM  Result Value Ref Range   FIO2 50.00    Delivery systems VENTILATOR    Mode ASSIST CONTROL    VT 450 mL   LHR 16.0 resp/min   Peep/cpap 5.0 cm H20   pH, Ven 7.09 (LL) 7.250 - 7.430    Comment: CRITICAL RESULT, NOTIFIED PHYSICIAN DR,SEDICKI, SEBATIN [id number] AT 2045 BY SMATHEW RRT    pCO2, Ven 39 (L) 44.0 - 60.0 mmHg   pO2, Ven 47.0 (H) 32.0 - 45.0 mmHg   Bicarbonate 11.8 (L) 20.0 - 28.0 mmol/L   Acid-base deficit 17.3 (H) 0.0 - 2.0 mmol/L   O2 Saturation 63.6 %   Patient temperature 37.0    Collection site VENOUS    Sample type VENOUS     Comment: Performed at [facility], [address] [place], [place] [address]  CBC with Differential     Status: Abnormal   Collection Time: 09/18/2017  8:25 PM  Result Value Ref Range   WBC 11.3 (H) 3.6 - 11.0 K/uL   RBC 3.19 (L) 3.80 - 5.20 MIL/uL   Hemoglobin 12.8 12.0 - 16.0 g/dL   HCT 38.0 35.0 - 47.0 %   MCV 119.0 (H) 80.0 - 100.0 fL   MCH 40.2 (H) 26.0 - 34.0 pg   MCHC 33.8  32.0 - 36.0 g/dL   RDW 16.8 (H) 11.5 - 14.5 %   Platelets 112 (L) 150 - 440 K/uL   Neutrophils Relative % 81 %   Neutro Abs 9.2 (H) 1.4 - 6.5 K/uL   Lymphocytes Relative 5 %   Lymphs Abs 0.6 (L) 1.0 - 3.6 K/uL   Monocytes Relative 14 %   Monocytes Absolute 1.5 (H) 0.2 - 0.9 K/uL  Eosinophils Relative 0 %   Eosinophils Absolute 0.0 0 - 0.7 K/uL   Basophils Relative 0 %   Basophils Absolute 0.0 0 - 0.1 K/uL    Comment: Performed at [facility], [address] [place], [place] [address]  Comprehensive metabolic panel     Status: Abnormal   Collection Time: 09/19/2017  8:25 PM  Result Value Ref Range   Sodium 137 135 - 145 mmol/L   Potassium 3.7 3.5 - 5.1 mmol/L   Chloride 104 101 - 111 mmol/L   CO2 13 (L) 22 - 32 mmol/L   Glucose, Bld 98 65 - 99 mg/dL   BUN 34 (H) 6 - 20 mg/dL   Creatinine, Ser 1.38 (H) 0.44 - 1.00 mg/dL   Calcium 8.2 (L) 8.9 - 10.3 mg/dL   Total Protein 7.1 6.5 - 8.1 g/dL   Albumin 2.4 (L) 3.5 - 5.0 g/dL   AST 208 (H) 15 - 41 U/L   ALT 69 (H) 14 - 54 U/L    Comment: RESULT CONFIRMED BY MANUAL DILUTION AKT   Alkaline Phosphatase 78 38 - 126 U/L   Total Bilirubin 10.7 (H) 0.3 - 1.2 mg/dL   GFR calc non Af Amer 42 (L) >60 mL/min   GFR calc Af Amer 49 (L) >60 mL/min    Comment: (NOTE) The eGFR has been calculated using the CKD EPI equation. This calculation has not been validated in all clinical situations. eGFR's persistently <60 mL/min signify possible Chronic Kidney Disease.    Anion gap 20 (H) 5 - 15    Comment: Performed at [facility], [address] [place], [place] [address]  Lactic acid, plasma     Status: Abnormal   Collection Time: 09/18/2017  8:25 PM  Result Value Ref Range   Lactic Acid, Venous 11.3 (HH) 0.5 - 1.9 mmol/L    Comment: CRITICAL RESULT CALLED TO, READ BACK BY AND VERIFIED WITH KAYLA KEEN ON 10/14/2017 AT 2147 [facility] Performed at [facility], [address], [place], [place] [address]   Culture, blood  (routine x 2)     Status: None (Preliminary result)   Collection Time: 09/18/2017  8:25 PM  Result Value Ref Range   Specimen Description BLOOD LT WRIST    Special Requests      BOTTLES DRAWN AEROBIC AND ANAEROBIC Blood Culture adequate volume   Culture  Setup Time Organism ID to follow    Culture      NO GROWTH < 12 HOURS Performed at [facility], [address], [place], [place] [address]    Report Status PENDING   Culture, blood (routine x 2)     Status: None (Preliminary result)   Collection Time: 09/16/2017  8:25 PM  Result Value Ref Range   Specimen Description BLOOD LEFT ANTECUBITAL    Special Requests      BOTTLES DRAWN AEROBIC AND ANAEROBIC Blood Culture results may not be optimal due to an excessive volume of blood received in culture bottles   Culture      NO GROWTH < 12 HOURS Performed at [facility], [address] [place], [place] [address]    Report Status PENDING   Troponin I     Status: Abnormal   Collection Time: 10/03/2017  8:25 PM  Result Value Ref Range   Troponin I 0.45 (HH) <0.03 ng/mL    Comment: CRITICAL RESULT CALLED TO, READ BACK BY AND VERIFIED WITH KAYLA KEEN ON 09/17/2017 AT 2111 [facility] Performed at  [facility], [address], [place], [place] [address]   Acetaminophen level     Status: Abnormal   Collection Time: 10/13/2017  8:25 PM  Result Value Ref Range   Acetaminophen (Tylenol), Serum <10 (L) 10 - 30 ug/mL    Comment:        THERAPEUTIC CONCENTRATIONS VARY SIGNIFICANTLY. A RANGE OF 10-30 ug/mL MAY BE AN EFFECTIVE CONCENTRATION FOR MANY PATIENTS. HOWEVER, SOME ARE BEST TREATED AT CONCENTRATIONS OUTSIDE THIS RANGE. ACETAMINOPHEN CONCENTRATIONS >150 ug/mL AT 4 HOURS AFTER INGESTION AND >50 ug/mL AT 12 HOURS AFTER INGESTION ARE OFTEN ASSOCIATED WITH TOXIC REACTIONS. Performed at [facility], [address] [place], [place] [address]   Salicylate level     Status: None   Collection Time: 10/13/2017  8:25  PM  Result Value Ref Range   Salicylate Lvl 9.8 2.8 - 30.0 mg/dL    Comment: Performed at [facility], [address] [place], [place] [address]  Ethanol     Status: None   Collection Time: 09/21/2017  8:25 PM  Result Value Ref Range   Alcohol, Ethyl (B) <10 <10 mg/dL    Comment:        LOWEST DETECTABLE LIMIT FOR SERUM ALCOHOL IS 10 mg/dL FOR MEDICAL PURPOSES ONLY Performed at [facility], [address] [place], [place] [address]   Lipase, blood     Status: None   Collection Time: 09/15/2017  8:25 PM  Result Value Ref Range   Lipase 46 11 - 51 U/L    Comment: Performed at [facility], [address] [place], [place] [address]  Protime-INR     Status: Abnormal   Collection Time: 10/07/2017  8:25 PM  Result Value Ref Range   Prothrombin Time 36.2 (H) 11.4 - 15.2 seconds   INR 3.67     Comment: Performed at [facility], [address] [place], [place] [address]  Urine Drug Screen, Qualitative ([place] only)     Status: None   Collection Time: 09/27/2017  8:25 PM  Result Value Ref Range   Tricyclic, Ur Screen NONE DETECTED NONE DETECTED   Amphetamines, Ur Screen NONE DETECTED NONE DETECTED   MDMA (Ecstasy)Ur Screen NONE DETECTED NONE DETECTED   Cocaine Metabolite,Ur [place] NONE DETECTED NONE DETECTED   Opiate, Ur Screen NONE DETECTED NONE DETECTED   Phencyclidine (PCP) Ur S NONE DETECTED NONE DETECTED   Cannabinoid 50 Ng, Ur [place] NONE DETECTED NONE DETECTED   Barbiturates, Ur Screen NONE DETECTED NONE DETECTED   Benzodiazepine, Ur Scrn NONE DETECTED NONE DETECTED   Methadone Scn, Ur NONE DETECTED NONE DETECTED    Comment: (NOTE) Tricyclics + metabolites, urine    Cutoff 1000 ng/mL Amphetamines + metabolites, urine  Cutoff 1000 ng/mL MDMA (Ecstasy), urine              Cutoff 500 ng/mL Cocaine Metabolite, urine          Cutoff 300 ng/mL Opiate + metabolites, urine        Cutoff 300 ng/mL Phencyclidine (PCP), urine         Cutoff 25  ng/mL Cannabinoid, urine                 Cutoff 50 ng/mL Barbiturates + metabolites, urine  Cutoff 200 ng/mL Benzodiazepine, urine              Cutoff 200 ng/mL Methadone, urine  Cutoff 300 ng/mL The urine drug screen provides only a preliminary, unconfirmed analytical test result and should not be used for non-medical purposes. Clinical consideration and professional judgment should be applied to any positive drug screen result due to possible interfering substances. A more specific alternate chemical method must be used in order to obtain a confirmed analytical result. Gas chromatography / mass spectrometry (GC/MS) is the preferred confirmat ory method. Performed at [facility], [address] [place], [place] [address]   Blood Culture ID Panel (Reflexed)     Status: Abnormal   Collection Time: 10/02/2017  8:25 PM  Result Value Ref Range   Enterococcus species NOT DETECTED NOT DETECTED   Listeria monocytogenes NOT DETECTED NOT DETECTED   Staphylococcus species DETECTED (A) NOT DETECTED    Comment: CRITICAL RESULT CALLED TO, READ BACK BY AND VERIFIED WITH:  NATE COOKSON AT 6415 10/07/17 SDR    Staphylococcus aureus DETECTED (A) NOT DETECTED    Comment: Methicillin (oxacillin) susceptible Staphylococcus aureus (MSSA). Preferred therapy is anti staphylococcal beta lactam antibiotic (Cefazolin or Nafcillin), unless clinically contraindicated. CRITICAL RESULT CALLED TO, READ BACK BY AND VERIFIED WITH:  NATE COOKSON AT 1321 10/07/17 SDR    Methicillin resistance NOT DETECTED NOT DETECTED   Streptococcus species NOT DETECTED NOT DETECTED   Streptococcus agalactiae NOT DETECTED NOT DETECTED   Streptococcus pneumoniae NOT DETECTED NOT DETECTED   Streptococcus pyogenes NOT DETECTED NOT DETECTED   Acinetobacter baumannii NOT DETECTED NOT DETECTED   Enterobacteriaceae species NOT DETECTED NOT DETECTED   Enterobacter cloacae complex NOT DETECTED NOT DETECTED    Escherichia coli NOT DETECTED NOT DETECTED   Klebsiella oxytoca NOT DETECTED NOT DETECTED   Klebsiella pneumoniae NOT DETECTED NOT DETECTED   Proteus species NOT DETECTED NOT DETECTED   Serratia marcescens NOT DETECTED NOT DETECTED   Haemophilus influenzae NOT DETECTED NOT DETECTED   Neisseria meningitidis NOT DETECTED NOT DETECTED   Pseudomonas aeruginosa NOT DETECTED NOT DETECTED   Candida albicans NOT DETECTED NOT DETECTED   Candida glabrata NOT DETECTED NOT DETECTED   Candida krusei NOT DETECTED NOT DETECTED   Candida parapsilosis NOT DETECTED NOT DETECTED   Candida tropicalis NOT DETECTED NOT DETECTED    Comment: Performed at [facility], [address] [place], [place] [address]  Lactic acid, plasma     Status: Abnormal   Collection Time: 09/18/2017 10:54 PM  Result Value Ref Range   Lactic Acid, Venous 8.9 (HH) 0.5 - 1.9 mmol/L    Comment: CRITICAL RESULT CALLED TO, READ BACK BY AND VERIFIED WITH BUTCH WOODS RN AT 2325 09/29/2017. MSS Performed at [facility], [address] [place], [place] [address]   Glucose, capillary     Status: Abnormal   Collection Time: 10/07/17 12:41 AM  Result Value Ref Range   Glucose-Capillary 54 (L) 65 - 99 mg/dL  Troponin I     Status: Abnormal   Collection Time: 10/07/17  1:42 AM  Result Value Ref Range   Troponin I 0.54 (HH) <0.03 ng/mL    Comment: CRITICAL VALUE NOTED. VALUE IS CONSISTENT WITH PREVIOUSLY REPORTED/CALLED VALUE.MSS Performed at [facility], [address] [place], [place] [address]   Lactic acid, plasma     Status: Abnormal   Collection Time: 10/07/17  1:42 AM  Result Value Ref Range   Lactic Acid, Venous 10.7 (HH) 0.5 - 1.9 mmol/L    Comment: RESULT CONFIRMED BY MANUAL DILUTION. MSS CRITICAL RESULT CALLED TO, READ BACK BY AND VERIFIED WITH TONY  WALKER ON 10/07/17 AT 0310 JAG Performed at [facility], [address] [place], [place] [address]   Procalcitonin     Status: None    Collection Time: 10/07/17  1:42 AM  Result Value Ref Range   Procalcitonin 0.48 ng/mL    Comment:        Interpretation: PCT (Procalcitonin) <= 0.5 ng/mL: Systemic infection (sepsis) is not likely. Local bacterial infection is possible. (NOTE)       Sepsis PCT Algorithm           Lower Respiratory Tract                                      Infection PCT Algorithm    ----------------------------     ----------------------------         PCT < 0.25 ng/mL                PCT < 0.10 ng/mL         Strongly encourage             Strongly discourage   discontinuation of antibiotics    initiation of antibiotics    ----------------------------     -----------------------------       PCT 0.25 - 0.50 ng/mL            PCT 0.10 - 0.25 ng/mL               OR       >80% decrease in PCT            Discourage initiation of                                            antibiotics      Encourage discontinuation           of antibiotics    ----------------------------     -----------------------------         PCT >= 0.50 ng/mL              PCT 0.26 - 0.50 ng/mL               AND        <80% decrease in PCT             Encourage initiation of                                             antibiotics       Encourage continuation           of antibiotics    ----------------------------     -----------------------------        PCT >= 0.50 ng/mL                  PCT > 0.50 ng/mL               AND         increase in PCT                  Strongly encourage  initiation of antibiotics    Strongly encourage escalation           of antibiotics                                     -----------------------------                                           PCT <= 0.25 ng/mL                                                 OR                                        > 80% decrease in PCT                                     Discontinue / Do not initiate                                              antibiotics Performed at [facility], [address] [place], [place] [address]   Fibrin derivatives D-[facility] only)     Status: Abnormal   Collection Time: 10/07/17  1:42 AM  Result Value Ref Range   Fibrin derivatives D-dimer ([facility]) 4,779.04 (H) 0.00 - 499.00 ng/mL (FEU)    Comment: (NOTE) <> Exclusion of Venous Thromboembolism (VTE) - OUTPATIENT ONLY   (Emergency Department or [place])   0-499 ng/ml (FEU): With a low to intermediate pretest probability                      for VTE this test result excludes the diagnosis                      of VTE.   >499 ng/ml (FEU) : VTE not excluded; additional work up for VTE is                      required. <> Testing on Inpatients and Evaluation of Disseminated Intravascular   Coagulation (DIC) Reference Range:   0-499 ng/ml (FEU) Performed at [facility], [address] [place], [place] [address]   Type and screen If need to transfuse blood products please use the blood administration order set     Status: None   Collection Time: 10/07/17  1:42 AM  Result Value Ref Range   ABO/RH(D) O POS    Antibody Screen NEG    Sample Expiration      10/10/2017 Performed at [facility], [address] [place], [place] [address]   Ammonia     Status: Abnormal   Collection Time: 10/07/17  1:57 AM  Result Value Ref Range   Ammonia 112 (H) 9 - 35 umol/L    Comment: Performed at [facility], [address] [place], [place] [address]  Glucose, capillary     Status: None   Collection Time: 10/07/17  3:07 AM  Result Value Ref Range   Glucose-Capillary 93 65 - 99 mg/dL  Blood gas, arterial     Status: Abnormal   Collection Time: 10/07/17  4:02 AM  Result Value Ref Range   FIO2 0.50    Delivery systems VENTILATOR    Mode PRESSURE REGULATED VOLUME CONTROL    VT 450 mL   LHR 16 resp/min   Peep/cpap 5.0 cm H20   pH, Arterial 7.19 (LL) 7.350 - 7.450    Comment: CRITICAL RESULT CALLED TO,  READ BACK BY AND VERIFIED WITH: MAGDELINE TUKOV AT 0410 ON 10/07/17 BL    pCO2 arterial 26 (L) 32.0 - 48.0 mmHg   pO2, Arterial 141 (H) 83.0 - 108.0 mmHg   Bicarbonate 9.5 (L) 20.0 - 28.0 mmol/L   Acid-base deficit 17.5 (H) 0.0 - 2.0 mmol/L   O2 Saturation 98.7 %   Patient temperature 35.8    Collection site LEFT BRACHIAL    Sample type ARTERIAL DRAW     Comment: Performed at [facility], [address] [place], [place] [address]  Troponin I     Status: Abnormal   Collection Time: 10/07/17  5:01 AM  Result Value Ref Range   Troponin I 0.67 (HH) <0.03 ng/mL    Comment: CRITICAL VALUE NOTED. VALUE IS CONSISTENT WITH PREVIOUSLY REPORTED/CALLED VALUE/HKP Performed at [facility], [address] [place], [place] [address]   Lactic acid, plasma     Status: Abnormal   Collection Time: 10/07/17  5:01 AM  Result Value Ref Range   Lactic Acid, Venous 10.7 (HH) 0.5 - 1.9 mmol/L    Comment: CRITICAL RESULT CALLED TO, READ BACK BY AND VERIFIED WITH TONY WALKER'@0558'  ON 10/07/17 BY HKP Performed at [facility], [address] [place], [place] [address]   CBC     Status: Abnormal   Collection Time: 10/07/17  5:01 AM  Result Value Ref Range   WBC 12.4 (H) 3.6 - 11.0 K/uL   RBC 2.56 (L) 3.80 - 5.20 MIL/uL   Hemoglobin 10.2 (L) 12.0 - 16.0 g/dL   HCT 30.5 (L) 35.0 - 47.0 %   MCV 118.8 (H) 80.0 - 100.0 fL   MCH 39.9 (H) 26.0 - 34.0 pg   MCHC 33.6 32.0 - 36.0 g/dL   RDW 16.9 (H) 11.5 - 14.5 %   Platelets 89 (L) 150 - 440 K/uL    Comment: Performed at [facility], [address] [place], [place] [address]  Basic metabolic panel     Status: Abnormal   Collection Time: 10/07/17  5:01 AM  Result Value Ref Range   Sodium 141 135 - 145 mmol/L   Potassium 3.5 3.5 - 5.1 mmol/L   Chloride 110 101 - 111 mmol/L   CO2 15 (L) 22 - 32 mmol/L   Glucose, Bld 66 65 - 99 mg/dL   BUN 32 (H) 6 - 20 mg/dL   Creatinine, Ser 1.75 (H) 0.44 - 1.00 mg/dL   Calcium  6.8 (L) 8.9 - 10.3 mg/dL   GFR calc non Af Amer 32 (L) >60 mL/min   GFR calc Af Amer 37 (L) >60 mL/min    Comment: (NOTE) The eGFR has been calculated using the CKD EPI equation. This calculation has not been validated in all clinical situations. eGFR's persistently <60 mL/min signify possible Chronic Kidney Disease.  Anion gap 16 (H) 5 - 15    Comment: Performed at [facility], [address] [place], [place] [address]  CK     Status: Abnormal   Collection Time: 10/07/17  5:01 AM  Result Value Ref Range   Total CK 993 (H) 38 - 234 U/L    Comment: Performed at [facility], [address] [place], [place] [address]  Fibrinogen     Status: Abnormal   Collection Time: 10/07/17  5:01 AM  Result Value Ref Range   Fibrinogen 184 (L) 210 - 475 mg/dL    Comment: Performed at [facility], [address] [place], [place] [address]  Amylase     Status: Abnormal   Collection Time: 10/07/17  5:01 AM  Result Value Ref Range   Amylase 139 (H) 28 - 100 U/L    Comment: Performed at [facility], [address] [place], [place] [address]  Glucose, capillary     Status: None   Collection Time: 10/07/17  6:00 AM  Result Value Ref Range   Glucose-Capillary 79 65 - 99 mg/dL  Glucose, capillary     Status: None   Collection Time: 10/07/17  8:12 AM  Result Value Ref Range   Glucose-Capillary 76 65 - 99 mg/dL  Troponin I     Status: Abnormal   Collection Time: 10/07/17  1:00 PM  Result Value Ref Range   Troponin I 1.24 (HH) <0.03 ng/mL    Comment: CRITICAL VALUE NOTED. VALUE IS CONSISTENT WITH PREVIOUSLY REPORTED/CALLED VALUE.MSS Performed at [facility], [address] [place], [place] [address]   Glucose, capillary     Status: None   Collection Time: 10/07/17  1:27 PM  Result Value Ref Range   Glucose-Capillary 70 65 - 99 mg/dL   No components found for: ESR, C REACTIVE PROTEIN MICRO: Recent Results (from the past 720 hour(s))   Culture, blood (routine x 2)     Status: None (Preliminary result)   Collection Time: 10/07/2017  8:25 PM  Result Value Ref Range Status   Specimen Description BLOOD LT WRIST  Final   Special Requests   Final    BOTTLES DRAWN AEROBIC AND ANAEROBIC Blood Culture adequate volume   Culture  Setup Time Organism ID to follow  Final   Culture   Final    NO GROWTH < 12 HOURS Performed at [facility], [address], [place], [place] [address]    Report Status PENDING  Incomplete  Culture, blood (routine x 2)     Status: None (Preliminary result)   Collection Time: 09/16/2017  8:25 PM  Result Value Ref Range Status   Specimen Description BLOOD LEFT ANTECUBITAL  Final   Special Requests   Final    BOTTLES DRAWN AEROBIC AND ANAEROBIC Blood Culture results may not be optimal due to an excessive volume of blood received in culture bottles   Culture   Final    NO GROWTH < 12 HOURS Performed at [facility], [address] [place], [place] [address]    Report Status PENDING  Incomplete  Blood Culture ID Panel (Reflexed)     Status: Abnormal   Collection Time: 10/01/2017  8:25 PM  Result Value Ref Range Status   Enterococcus species NOT DETECTED NOT DETECTED Final   Listeria monocytogenes NOT DETECTED NOT DETECTED Final   Staphylococcus species DETECTED (A) NOT DETECTED Final    Comment: CRITICAL RESULT CALLED TO, READ BACK BY AND VERIFIED  WITH:  NATE COOKSON AT 1321 10/07/17 SDR    Staphylococcus aureus DETECTED (A) NOT DETECTED Final    Comment: Methicillin (oxacillin) susceptible Staphylococcus aureus (MSSA). Preferred therapy is anti staphylococcal beta lactam antibiotic (Cefazolin or Nafcillin), unless clinically contraindicated. CRITICAL RESULT CALLED TO, READ BACK BY AND VERIFIED WITH:  [place] AT 5053 10/07/17 SDR    Methicillin resistance NOT DETECTED NOT DETECTED Final   Streptococcus species NOT DETECTED NOT DETECTED Final   Streptococcus agalactiae NOT  DETECTED NOT DETECTED Final   Streptococcus pneumoniae NOT DETECTED NOT DETECTED Final   Streptococcus pyogenes NOT DETECTED NOT DETECTED Final   Acinetobacter baumannii NOT DETECTED NOT DETECTED Final   Enterobacteriaceae species NOT DETECTED NOT DETECTED Final   Enterobacter cloacae complex NOT DETECTED NOT DETECTED Final   Escherichia coli NOT DETECTED NOT DETECTED Final   Klebsiella oxytoca NOT DETECTED NOT DETECTED Final   Klebsiella pneumoniae NOT DETECTED NOT DETECTED Final   Proteus species NOT DETECTED NOT DETECTED Final   Serratia marcescens NOT DETECTED NOT DETECTED Final   Haemophilus influenzae NOT DETECTED NOT DETECTED Final   Neisseria meningitidis NOT DETECTED NOT DETECTED Final   Pseudomonas aeruginosa NOT DETECTED NOT DETECTED Final   Candida albicans NOT DETECTED NOT DETECTED Final   Candida glabrata NOT DETECTED NOT DETECTED Final   Candida krusei NOT DETECTED NOT DETECTED Final   Candida parapsilosis NOT DETECTED NOT DETECTED Final   Candida tropicalis NOT DETECTED NOT DETECTED Final    Comment: Performed at [facility], [address] [place], [place] [address]    IMAGING: Ct Head Wo Contrast  Result Date: 10/12/2017 CLINICAL DATA:  Altered LOC EXAM: CT HEAD WITHOUT CONTRAST TECHNIQUE: Contiguous axial images were obtained from the base of the skull through the vertex without intravenous contrast. COMPARISON:  None. FINDINGS: Brain: No acute territorial infarction, hemorrhage or intracranial mass is visualized. Mild atrophy. Mild small vessel ischemic changes of the white matter. Ventricles are nonenlarged Vascular: No hyperdense vessels. Scattered calcifications at the carotid siphons. Skull: Fluid in the bilateral mastoid air cells.  No fracture Sinuses/Orbits: Mucosal thickening in the ethmoid and maxillary sinuses. No acute orbital abnormality Other: None IMPRESSION: 1. No CT evidence for acute intracranial abnormality. 2. Atrophy and mild small vessel  ischemic changes of the white matter 3. Bilateral mastoid effusion Electronically Signed   By: Donavan Foil M.D.   On: 09/26/2017 21:28   Dg Chest Port 1 View  Result Date: 10/07/2017 CLINICAL DATA:  Central line placement EXAM: PORTABLE CHEST 1 VIEW COMPARISON:  And chest radiograph 09/19/2017 FINDINGS: Right IJ approach central venous catheter tip is at the cavoatrial junction. No pneumothorax. The lungs and the remainder of the support apparatus are unchanged. Endotracheal tube tip remains just above the carina and should be retracted by 4 cm. IMPRESSION: 1. Right IJ central venous catheter tip at the cavoatrial junction. No pneumothorax. 2. Endotracheal tube tip just above the opening to the right mainstem bronchus. Recommend retraction by 4 cm. 3. Unchanged appearance of bilateral airspace disease. Electronically Signed   By: Ulyses Jarred M.D.   On: 10/07/2017 04:53   Dg Chest Portable 1 View  Result Date: 10/03/2017 CLINICAL DATA:  Altered mental status EXAM: PORTABLE CHEST 1 VIEW COMPARISON:  None. FINDINGS: Endotracheal tube tip is near the orifice for right mainstem bronchus. Esophageal tube tip is below the diaphragm but non included. Cardiomegaly with vascular congestion. Low lung volumes. Multifocal airspace disease in the upper lobes and left base. Aortic atherosclerosis.  Low right paratracheal opacity, possible vascular shadow or node. No pneumothorax IMPRESSION: 1. Endotracheal tube tip near the orifice for right mainstem bronchus 2. Esophageal tube tip below the diaphragm but non included 3. Multifocal airspace disease in the upper lobes and left base concerning for multifocal pneumonia 4. Cardiomegaly with vascular congestion. Right paratracheal opacity, prominent vascular shadow versus nodes. Electronically Signed   By: Donavan Foil M.D.   On: 10/03/2017 20:53   Dg Abd Portable 1 View  Result Date: 09/23/2017 CLINICAL DATA:  OG placement EXAM: PORTABLE ABDOMEN - 1 VIEW COMPARISON:   None. FINDINGS: Esophageal tube tip overlies the proximal stomach. Mild diffuse gaseous dilatation of small bowel measuring up to 3.3 cm. IMPRESSION: 1. Esophageal tube tip projects over the proximal stomach 2. Mild diffuse gaseous distention of small bowel, possible ileus Electronically Signed   By: Donavan Foil M.D.   On: 09/20/2017 20:54    Assessment:   Whitney Olson is a 57 y.o. female with unknown past medical history except for history of alcoholism admitted after being found unresponsive, hypotensive and hypoglycemic.  She was found to have multifocal pneumonia on x-ray and is now intubated and sedated.  Blood cultures are positive for methicillin sensitive staph aureus.  She also has elevated liver for enzymes, T bili of 10.7, elevated INR all of these are consistent with likely cirrhosis. MRSA PCR negative.  Recommendations Methicillin sensitive staph aureus bacteremia-she will need follow-up blood cultures as well as an echocardiogram.  She will need aggressive treatment up front with unasyn.  This will cover aspiration pneumonia as well as methicillin sensitive staph aureus. Since MRSA PCR is negative we can stop vancomycin. If sputum cx only grows MSSA can then narrow to ancef Will need 4 weeks likely of IV abx. Will need to monitor for metastatic sites of infection.  If TTE neg would get a TEE while still intubated  Multifocal pneumonia-possible aspiration given the clinical picture.  Will cover for aspiration and MSSA as above. pending  sputum culture. Thank you very much for allowing me to participate in the care of this patient. Please call with questions.   Cheral Marker. Ola Spurr, MD

## 2017-10-07 NOTE — Progress Notes (Signed)
RN spoke with Dr. Sung AmabileSimonds and made MD aware that patient has fever of 101.7, Dr. Sung AmabileSimonds stated that he could not give the patient and medications to help the fever so to use mechanical measures.  Sheet only on patient and room temperature adjusted.  Will continue to monitor.

## 2017-10-07 NOTE — Progress Notes (Signed)
Pharmacy Antibiotic Note  Whitney Olson is a 57 y.o. female admitted on 10/05/2017 with MSSA bacteremia and potential aspiration pneumonia.  Pharmacy has been consulted for Unasyn dosing. Patient to start CRRT today.   Plan: Unasyn 3g IV Q6hr. Patient has second set of blood cultures ordered. Per ID, if patient has negative tracheal aspirate culture, will be able to transition patient to cefazolin. Will need new line during hospital stay as initial line was placed while patient was bacteremic.   Height: 5\' 5"  (165.1 cm) Weight: 183 lb 3.2 oz (83.1 kg) IBW/kg (Calculated) : 57  Temp (24hrs), Avg:98.3 F (36.8 C), Min:94.9 F (34.9 C), Max:101.3 F (38.5 C)  Recent Labs  Lab 10/04/2017 2025 10/05/2017 2254 10/07/17 0142 10/07/17 0501  WBC 11.3*  --   --  12.4*  CREATININE 1.38*  --   --  1.75*  LATICACIDVEN 11.3* 8.9* 10.7* 10.7*    Estimated Creatinine Clearance: 38.6 mL/min (A) (by C-G formula based on SCr of 1.75 mg/dL (H)).    Not on File  Antimicrobials this admission: Zosyn 4/22  >> 4/23 Vancomycin 4/22 >> 4/23  Cefepime 4/23 x 1 Unasyn 4/23 >>   Dose adjustments this admission: N/A  Microbiology results: 4/22 BCx: MSSA 4/23 BCx: pending  4/23 Tracheal Aspirate: pending  4/23 MRSA PCR: negatuve  Thank you for allowing pharmacy to be a part of this patient's care.  Simpson,Michael L 10/07/2017 3:26 PM

## 2017-10-07 NOTE — Procedures (Signed)
Central Venous Catheter Insertion Procedure Note Bonnee QuinVickie M Bolotin [id number] 09/12/1960  Procedure: Insertion of Central Venous Catheter Indications: Assessment of intravascular volume, Drug and/or fluid administration and Frequent blood sampling  Procedure Details Consent: Unable to obtain consent because of emergent medical necessity. Time Out: Verified patient identification, verified procedure, site/side was marked, verified correct patient position, special equipment/implants available, medications/allergies/relevent history reviewed, required imaging and test results available.  Performed  Maximum sterile technique was used including antiseptics, cap, gloves, gown, hand hygiene, mask and sheet. Skin prep: Chlorhexidine; local anesthetic administered A antimicrobial bonded/coated triple lumen catheter was placed in the right internal jugular vein using the Seldinger technique.  Evaluation Blood flow good Complications: No apparent complications Patient did tolerate procedure well. Chest X-ray ordered to verify placement.  CXR: normal.  Procedure performed under direct supervision of Dr.Simonds. Ultrasound utilized for realtime vessel cannulation  Henley Blyth S. [facility] ANP-BC Pulmonary and Critical Care Medicine [facility] Pager [phone] or [phone]  NB: This document was prepared using Dragon voice recognition software and may include unintentional dictation errors.

## 2017-10-07 NOTE — Progress Notes (Addendum)
PULMONARY / CRITICAL CARE MEDICINE   Name: Whitney Olson MRN: [id number] DOB: 05/02/1961    ADMISSION DATE:  10/02/2017 CONSULTATION DATE:  04/23  PT PROFILE: [age] F with no documented medical history but with reported severe alcohol abuse [facility] by [facility] ED with altered mental status.  EMS initially found her to be hypoglycemic.  Cognition did not improve with D50.  Intubated emergently in the ED.  Severely jaundiced.  Volume refractory shock requiring vasopressors.  Anuric renal failure with acidosis.  Working diagnosis of severe sepsis.  MAJOR EVENTS/TEST RESULTS: 04/22 admitted as documented above 04/22 CT head: No acute findings 04/23 comatose, intubated, norepi @ 20 mcg/min. Vasopressin initiated. Anuric. Renal consultation requested. CRRT recommended. BCs positive for S aureus 04/23 Echocardiogram:   INDWELLING DEVICES:: ETT 04/22 >>  R IJ CVL 04/23 >>   MICRO DATA: MRSA PCR 04/23 >> NEG Urine 04/22 >>  Resp 04/23 >>  Blood 04/22 >>   ANTIMICROBIALS:  Vanc 04/22 >>  Cefepime 04/22 >>     SUBJECTIVE:  Unresponsive, intubated, severe jaundice.  Synchronous with ventilator.  On vasopressors.  Oligo-anuric.   VITAL SIGNS: BP (!) 137/53   Pulse (!) 122   Temp (!) 101.3 F (38.5 C)   Resp 15   Ht 5\' 5"  (1.651 m)   Wt 183 lb 3.2 oz (83.1 kg)   SpO2 91%   BMI 30.49 kg/m   HEMODYNAMICS: CVP:  [0 mmHg-27 mmHg] 7 mmHg  VENTILATOR SETTINGS: Vent Mode: PCV FiO2 (%):  [40 %-50 %] 40 % Set Rate:  [14 bmp-16 bmp] 14 bmp Vt Set:  [450 mL] 450 mL PEEP:  [5 cmH20] 5 cmH20 Plateau Pressure:  [14 cmH20-26 cmH20] 19 cmH20  INTAKE / OUTPUT: I/O last 3 completed shifts: In: 2209 [I.V.:892.7; IV Piggyback:1316.4] Out: 15 [Urine:15]  PHYSICAL EXAMINATION: General: RASS -5, not F/C, profoundly icteric Neuro: PERRL, MAEs HEENT: NCAT, sclericterus Cardiovascular: regular, III/VI harsh systolic M radiating across precordium Lungs: Diffuse bilateral  rhonchi Abdomen: Diminished bowel sounds, no palpable masses, not overtly tender Extremities: Warm, no edema Skin: Severe jaundice  LABS:  BMET Recent Labs  Lab 10/05/2017 2025 10/07/17 0501  NA 137 141  K 3.7 3.5  CL 104 110  CO2 13* 15*  BUN 34* 32*  CREATININE 1.38* 1.75*  GLUCOSE 98 66    Electrolytes Recent Labs  Lab 09/25/2017 2025 10/07/17 0501  CALCIUM 8.2* 6.8*    CBC Recent Labs  Lab 09/18/2017 2025 10/07/17 0501  WBC 11.3* 12.4*  HGB 12.8 10.2*  HCT 38.0 30.5*  PLT 112* 89*    Coag's Recent Labs  Lab 10/14/2017 2025  INR 3.67    Sepsis Markers Recent Labs  Lab 09/23/2017 2254 10/07/17 0142 10/07/17 0501  LATICACIDVEN 8.9* 10.7* 10.7*  PROCALCITON  --  0.48  --     ABG Recent Labs  Lab 10/07/17 0402  PHART 7.19*  PCO2ART 26*  PO2ART 141*    Liver Enzymes Recent Labs  Lab 09/27/2017 2025  AST 208*  ALT 69*  ALKPHOS 78  BILITOT 10.7*  ALBUMIN 2.4*    Cardiac Enzymes Recent Labs  Lab 10/07/17 0142 10/07/17 0501 10/07/17 1300  TROPONINI 0.54* 0.67* 1.24*    Glucose Recent Labs  Lab 10/07/17 0041 10/07/17 0307 10/07/17 0600 10/07/17 0812 10/07/17 1327  GLUCAP 54* 93 79 76 70    CXR: Bilateral diffuse patchy infiltrates    ASSESSMENT / PLAN:  PULMONARY A: Ventilator dependent respiratory failure.  Intubated due  to AMS Probable pneumonia ALI/ARDS P:   Cont full vent support - settings reviewed and/or adjusted Cont vent bundle Daily SBT if/when meets criteria   CARDIOVASCULAR A:  Septic shock Mildly elevated trop I - not a primary cardiac event P:  MAP goal > 65 mmHg Continue NE - titrate for MAP goal Continue empiric hydrocortisone VP added 04/23  RENAL A:   Anuric AKI, likely ATN due to sepsis Metabolic acidosis Lactic acidosis due to septic shock and liver failure P:   Monitor BMET intermittently Monitor I/Os Correct electrolytes as indicated  Continue bicarbonate infusion Nephrology  consultation 04/23 - CRRT recommended  GASTROINTESTINAL A:   Alcoholic liver disease P:   SUP: IV famotidine No nutrition until more hemodynamically and physiologically stable RUQ ultrasound ordered 04/23  HEMATOLOGIC A:   Thrombocytopenia Macrocytic anemia Coagulopathy due to liver failure P:  DVT px: SCDs Monitor CBC, coags intermittently Transfuse per usual guidelines   INFECTIOUS A:   Severe sepsis Staph aureus bacteremia Probable pneumonia P:   Monitor temp, WBC count Micro and abx as above  Abx per ID  ENDOCRINE A:   Episodic hypoglycemia P:   Monitor CBGs Avoid insulin  NEUROLOGIC A:   Hyperammonemia/ hepatic encephalopathy Septic encephalopathy ICU/ventilator associated discomfort P:   RASS goal: -1, -2 PAD protocol Thiamine - high dose   FAMILY  - Updates: No family at bedside  CCM time: 60 mins The above time includes time spent in consultation with patient and/or family members and reviewing care plan on multidisciplinary rounds  Billy Fischer, MD PCCM service Mobile [phone] Pager [phone] 10/07/2017, 3:36 PM

## 2017-10-07 NOTE — Consult Note (Signed)
PULMONARY / CRITICAL CARE MEDICINE   Name: Whitney Olson MRN: [id number] DOB: 12/19/1960    ADMISSION DATE:  06-Mar-2018   CONSULTATION DATE:  10/07/2017  REFERRING MD:  Dr Anne HahnWillis  REASON: Unresponsive  HISTORY OF PRESENT ILLNESS:   This is a 57 year old Caucasian female with a history of alcohol abuse who was found unresponsive by family.  Patient was on the floor and covered with feces upon EMS arrival.  When EMS arrived, she was cyanotic snoring and face down.  Her blood sugar was low and she was given dextrose and a nasal trumpet was placed and patient was transferred to the emergency room. she was emergently intubated upon arrival for airway protection as patient was agitated, severely lethargic and unable to protect her airway.  Her ED workup showed a troponin level of 0.45, a creatinine of 1.38, elevated LFTs, severe lactic acidosis, a PT of 36.2, INR of 3.67, abnormal urinalysis suggestive of UTI and a pH of 7.09 on a venous gas.  She is being admitted to the ICU for further management.  PAST MEDICAL HISTORY :  She  has a past medical history of Alcoholism (HCC).  PAST SURGICAL HISTORY: She  has no past surgical history on file.  Not on File  No current facility-administered medications on file prior to encounter.    No current outpatient medications on file prior to encounter.    FAMILY HISTORY:  Her has no family status information on file.    SOCIAL HISTORY: She  has an unknown smoking status. She has never used smokeless tobacco. She reports that she drinks alcohol. She reports that she has current or past drug history.  REVIEW OF SYSTEMS:   Unable to obtain as patient is intubated and sedated  SUBJECTIVE:   VITAL SIGNS: BP (!) 114/41   Pulse 88   Temp (!) 95.4 F (35.2 C)   Resp 18   Ht 5\' 5"  (1.651 m)   Wt 183 lb 3.2 oz (83.1 kg)   SpO2 100%   BMI 30.49 kg/m   HEMODYNAMICS:    VENTILATOR SETTINGS: Vent Mode: PRVC FiO2 (%):  [50 %] 50 % Set Rate:   [16 bmp] 16 bmp Vt Set:  [450 mL] 450 mL PEEP:  [5 cmH20] 5 cmH20 Plateau Pressure:  [26 cmH20] 26 cmH20  INTAKE / OUTPUT: No intake/output data recorded.  PHYSICAL EXAMINATION: General: Sedated Neuro: Withdraws to noxious stimulus HEENT: Pupils sluggish, corneal reflex, conjunctivae icteric Cardiovascular: Apical pulse regular, S1-S2, no murmur regurg or gallop, +2 pulses in upper extremities and nonpalpable pulses in bilateral lower extremities Lungs: Lateral breath sounds with significantly diminished breath sounds in the bases, no wheezes or rhonchi Abdomen: Nondistended, normal bowel sounds in all 4 quadrants, palpation reveals no organomegaly Musculoskeletal: Positive range of motion in upper and lower extremities no deformities Skin: Jaundice, severe venous stasis changes in bilateral lower extremities, right greater than left, multiple track marks in upper and lower extremities  LABS:  BMET Recent Labs  Lab 02-13-18 2025  NA 137  K 3.7  CL 104  CO2 13*  BUN 34*  CREATININE 1.38*  GLUCOSE 98    Electrolytes Recent Labs  Lab 02-13-18 2025  CALCIUM 8.2*    CBC Recent Labs  Lab 02-13-18 2025  WBC 11.3*  HGB 12.8  HCT 38.0  PLT 112*    Coag's Recent Labs  Lab 02-13-18 2025  INR 3.67    Sepsis Markers Recent Labs  Lab 02-13-18 2025 02-13-18 2254  LATICACIDVEN 11.3* 8.9*    ABG No results for input(s): PHART, PCO2ART, PO2ART in the last 168 hours.  Liver Enzymes Recent Labs  Lab 15-Oct-2017 2025  AST 208*  ALT 69*  ALKPHOS 78  BILITOT 10.7*  ALBUMIN 2.4*    Cardiac Enzymes Recent Labs  Lab Oct 15, 2017 2025  TROPONINI 0.45*    Glucose Recent Labs  Lab 10/07/17 0041  GLUCAP 54*    Imaging Ct Head Wo Contrast  Result Date: 10-15-2017 CLINICAL DATA:  Altered LOC EXAM: CT HEAD WITHOUT CONTRAST TECHNIQUE: Contiguous axial images were obtained from the base of the skull through the vertex without intravenous contrast. COMPARISON:   None. FINDINGS: Brain: No acute territorial infarction, hemorrhage or intracranial mass is visualized. Mild atrophy. Mild small vessel ischemic changes of the white matter. Ventricles are nonenlarged Vascular: No hyperdense vessels. Scattered calcifications at the carotid siphons. Skull: Fluid in the bilateral mastoid air cells.  No fracture Sinuses/Orbits: Mucosal thickening in the ethmoid and maxillary sinuses. No acute orbital abnormality Other: None IMPRESSION: 1. No CT evidence for acute intracranial abnormality. 2. Atrophy and mild small vessel ischemic changes of the white matter 3. Bilateral mastoid effusion Electronically Signed   By: Jasmine Pang M.D.   On: 2017-10-15 21:28   Dg Chest Portable 1 View  Result Date: 15-Oct-2017 CLINICAL DATA:  Altered mental status EXAM: PORTABLE CHEST 1 VIEW COMPARISON:  None. FINDINGS: Endotracheal tube tip is near the orifice for right mainstem bronchus. Esophageal tube tip is below the diaphragm but non included. Cardiomegaly with vascular congestion. Low lung volumes. Multifocal airspace disease in the upper lobes and left base. Aortic atherosclerosis. Low right paratracheal opacity, possible vascular shadow or node. No pneumothorax IMPRESSION: 1. Endotracheal tube tip near the orifice for right mainstem bronchus 2. Esophageal tube tip below the diaphragm but non included 3. Multifocal airspace disease in the upper lobes and left base concerning for multifocal pneumonia 4. Cardiomegaly with vascular congestion. Right paratracheal opacity, prominent vascular shadow versus nodes. Electronically Signed   By: Jasmine Pang M.D.   On: 15-Oct-2017 20:53   Dg Abd Portable 1 View  Result Date: 2017/10/15 CLINICAL DATA:  OG placement EXAM: PORTABLE ABDOMEN - 1 VIEW COMPARISON:  None. FINDINGS: Esophageal tube tip overlies the proximal stomach. Mild diffuse gaseous dilatation of small bowel measuring up to 3.3 cm. IMPRESSION: 1. Esophageal tube tip projects over the  proximal stomach 2. Mild diffuse gaseous distention of small bowel, possible ileus Electronically Signed   By: Jasmine Pang M.D.   On: Oct 15, 2017 20:54   STUDIES:  2D echo pending  CULTURES: Blood cultures x2 Urine culture  ANTIBIOTICS: Zosyn Vancomycin  SIGNIFICANT EVENTS: 10/07/2017: Found unresponsive, brought to the emergency room, intubated and admitted to the ICU  LINES/TUBES: Peripheral IVs Foley catheter Central venous catheter  DISCUSSION: 57 year old female with a history of alcohol abuse found unresponsive in septic shock, sepsis secondary to UTI, acute hypoxic respiratory failure and possible overdose on unknown substance  ASSESSMENT / PLAN:  PULMONARY A: Acute respiratory failure Multifocal pneumonia likely due to aspiration Severe lactic and metabolic acidosis P:   Full vent support with current settings ABG and chest x-ray post intubation reviewed Broad-spectrum antibiotics VAP protocol ABG and chest x-ray as needed  CARDIOVASCULAR A:  Severe shock-multifactorial; septic, hypovolemic, and cardiogenic Elevated troponin P:  IV fluids CVP every 4 hours and bolus 500 cc until CVP greater than or equal 12 More dynamic monitoring per ICU protocol 2D echo Cycle cardiac enzymes Consider cardiology  consult if troponins continue to trend up  RENAL A:   Acute renal failure ?Rhabdomyolysis-CK level pending P:   Trend creatinine IV fluids Follow-up CK level   GASTROINTESTINAL A:   Jaundice Possible alcoholic liver cirrhosis Elevated ammonia level P:   Liver ultrasound in the morning Trend LFTs Lactulose as scheduled for elevated ammonia level Trend AMI ammonia level  HEMATOLOGIC A:   Possible DIC/elevated d-dimer level Coagulopathy secondary to hepatic failure- INR 3.67; not on any anticoagulation P:  Trend PT/INR Transfuse fresh frozen plasma as needed for any bleeding Type and screen  INFECTIOUS A:   Urosepsis-urine culture  pending P:   Trend pro-calcitonin Continue broad-spectrum antibiotics Follow-up urine and blood cultures  ENDOCRINE A:   Hyperglycemia without a history of diabetes P:   Blood glucose monitoring every 4 hours with dextrose as needed  NEUROLOGIC A:   Severe hepatic encephalopathy secondary to ammonia  elevated ammonia level-CT head negative P:   RASS goal: 0 to -1 Neuro checks every 2 hours Continue lactulose and if ammonia level is still elevated after 2 doses of lactulose start Xifaxan Trend ammonia level  FAMILY  - Updates: No family at bedside.  Will update when available  - Inter-disciplinary family meet or Palliative Care meeting due by:  day 7  Rexann Lueras S. [facility] ANP-BC Pulmonary and Critical Care Medicine [facility] Pager [phone] or [phone]  NB: This document was prepared using Dragon voice recognition software and may include unintentional dictation errors.   10/07/2017, 1:48 AM

## 2017-10-07 NOTE — Progress Notes (Signed)
*  PRELIMINARY RESULTS* Echocardiogram 2D Echocardiogram has been performed.  Cristela BlueHege, Mavery Milling 10/07/2017, 12:01 PM

## 2017-10-07 NOTE — Procedures (Signed)
Hemodialysis Catheter Insertion Procedure Note Whitney Olson [id number] 12/13/1960  Procedure: Insertion of Hemodialysis Catheter Indications: Dialysis Access   Procedure Details Consent: Risks of procedure as well as the alternatives and risks of each were explained to the (patient/caregiver).  Consent for procedure obtained. Time Out: Verified patient identification, verified procedure, site/side was marked, verified correct patient position, special equipment/implants available, medications/allergies/relevent history reviewed, required imaging and test results available.  Performed  Maximum sterile technique was used including antiseptics, cap, gloves, gown, hand hygiene, mask and sheet. Skin prep: Chlorhexidine; local anesthetic administered Double lumen hemodialysis catheter was inserted into left internal jugular vein using the Seldinger technique.  Evaluation Blood flow good Complications: No apparent complications Patient did tolerate procedure well. Chest X-ray ordered to verify placement.  CXR: pending.   Left internal jugular temporary dialysis catheter placed utilizing ultrasound no complications noted during and following procedure  Whitney Olson, AGNP  Pulmonary/Critical Care Pager [phone] (please enter 7 digits) PCCM Consult Pager [phone] (please enter 7 digits)

## 2017-10-07 NOTE — Progress Notes (Signed)
CRRT initiated at 2205 via left IJ Hemodialysis catheter per Nephrology orders.

## 2017-10-07 NOTE — Progress Notes (Signed)
PHARMACY - CRITICAL CARE PROGRESS NOTE  Pharmacy Consult for Critical Care Monitoring Indication: Acute respiratory distress   Not on File  Patient Measurements: Height: 5\' 6"  (167.6 cm) Weight: 170 lb (77.1 kg) IBW/kg (Calculated) : 59.3 Adjusted Body Weight: 67 kg  Vital Signs: Temp: 95.4 F (35.2 C) (04/22 2245) Temp Source: Bladder (04/22 2043) BP: 145/48 (04/23 0004) Pulse Rate: 88 (04/23 0004) Intake/Output from previous day: 04/22 0701 - 04/23 0700 In: 1200 [IV Piggyback:1200] Out: -  Intake/Output from this shift: Total I/O In: 1200 [IV Piggyback:1200] Out: -  Vent settings for last 24 hours: Vent Mode: AC FiO2 (%):  [50 %] 50 % Set Rate:  [16 bmp] 16 bmp Vt Set:  [450 mL] 450 mL PEEP:  [5 cmH20] 5 cmH20  Labs: Recent Labs    10/14/2017 2025  WBC 11.3*  HGB 12.8  HCT 38.0  PLT 112*  INR 3.67  CREATININE 1.38*  ALBUMIN 2.4*  PROT 7.1  AST 208*  ALT 69*  ALKPHOS 78  BILITOT 10.7*   Estimated Creatinine Clearance: 48.3 mL/min (A) (by C-G formula based on SCr of 1.38 mg/dL (H)).  No results for input(s): GLUCAP in the last 72 hours.  Microbiology: No results found for this or any previous visit (from the past 720 hour(s)).  Medications:  Scheduled:  . [START ON 10/10/2017] pantoprazole  40 mg Intravenous Q12H    Assessment: Patient admitted for being unresponsive, found face down and cyanotic. Pharmacy consulted for CCM and adjustment of abx per renal function.  Plan:  No adjustments needed at this time.  Thomasene Rippleavid Kyia Rhude, PharmD, BCPS Clinical Pharmacist 10/07/2017

## 2017-10-07 NOTE — Progress Notes (Signed)
Fever started to come down after bath and room cooled.  Blood pressure supported with vasopressin and levophed.  All sedation off and patient remains unresponsive.  Son updated by Dr. Sung AmabileSimonds this afternoon.

## 2017-10-07 NOTE — Consult Note (Signed)
[facility]  CONSULT NOTE    Date: 10/07/2017                  Patient Name:  Whitney Olson  MRN: [id number]  DOB: 1961-06-09  Age / Sex: 57 y.o., female         PCP: Patient, No Pcp Per                 Service Requesting Consult: Dr. Jamal Collin.                  Reason for Consult: Acute renal failure            History of Present Illness: Whitney Olson is a 57 y.o. white female with history of alcohol abuse, who was admitted to [facility] on 09/15/2017 for Acute respiratory failure, unspecified whether with hypoxia or hypercapnia ([place]) [J96.00] Altered mental status, unspecified altered mental status type [R41.82]  Nephrology consulted for acute renal failure on metabolic acidosis, sepsis, and anemia/thrombocytopenia.   Placed on norepinephrine, vasopressin, and placed on solucortef and bicarb gtt. Empiric antibiotics with cefepime and vasopressin.   Intubated and sedated.   Medications: Outpatient medications: No medications prior to admission.    Current medications: Current Facility-Administered Medications  Medication Dose Route Frequency Provider Last Rate Last Dose  . 0.9 %  sodium chloride infusion  250 mL Intravenous PRN Lance Coon, MD      . aspirin chewable tablet 324 mg  324 mg Oral NOW Lance Coon, MD       Or  . aspirin suppository 300 mg  300 mg Rectal NOW Lance Coon, MD      . bisacodyl (DULCOLAX) suppository 10 mg  10 mg Rectal Daily PRN Dorene Sorrow S, NP      . ceFEPIme (MAXIPIME) 2 g in sodium chloride 0.9 % 100 mL IVPB  2 g Intravenous q1800 Gouru, Aruna, MD      . chlorhexidine gluconate (MEDLINE KIT) (PERIDEX) 0.12 % solution 15 mL  15 mL Mouth Rinse BID Dorene Sorrow S, NP   15 mL at 10/07/17 0828  . famotidine (PEPCID) IVPB 20 mg premix  20 mg Intravenous Q12H Mikael Spray, NP   Stopped at 10/07/17 1125  . fentaNYL (SUBLIMAZE) bolus via infusion 50 mcg  50 mcg Intravenous Q1H PRN Tukov, Magadalene S, NP       . fentaNYL 2516mg in NS 2548m(1033mml) infusion-PREMIX  25-400 mcg/hr Intravenous Continuous Tukov, Magadalene S, NP 20 mL/hr at 10/07/17 1240 200 mcg/hr at 10/07/17 1240  . folic acid 1 mg in sodium chloride 0.9 % 50 mL IVPB  1 mg Intravenous Daily SimWilhelmina McardleD   Stopped at 10/07/17 1420  . hydrocortisone sodium succinate (SOLU-CORTEF) 100 MG injection 50 mg  50 mg Intravenous Q6H Tukov, Magadalene S, NP   50 mg at 10/07/17 0837  . ipratropium-albuterol (DUONEB) 0.5-2.5 (3) MG/3ML nebulizer solution 3 mL  3 mL Nebulization Q6H Tukov, Magadalene S, NP   3 mL at 10/07/17 1406  . lactulose (CHRONULAC) 10 GM/15ML solution 30 g  30 g Oral BID SimWilhelmina McardleD   30 g at 10/07/17 1243  . MEDLINE mouth rinse  15 mL Mouth Rinse QID TukDorene Sorrow NP   15 mL at 10/07/17 1255  . midazolam (VERSED) 50 mg in sodium chloride 0.9 % 50 mL (1 mg/mL) infusion  1 mg/hr Intravenous Continuous WilLance CoonD   Stopped at 10/07/17 1057  .  midazolam (VERSED) injection 2 mg  2 mg Intravenous Q15 min PRN Tukov, Magadalene S, NP      . norepinephrine (LEVOPHED) 16 mg in dextrose 5 % 250 mL (0.064 mg/mL) infusion  0-40 mcg/min Intravenous Titrated Tukov, Magadalene S, NP 18.8 mL/hr at 10/07/17 0840 20 mcg/min at 10/07/17 0840  . ondansetron (ZOFRAN) injection 4 mg  4 mg Intravenous Q6H PRN Lance Coon, MD      . pantoprazole (PROTONIX) 80 mg in sodium chloride 0.9 % 250 mL (0.32 mg/mL) infusion  8 mg/hr Intravenous Continuous Lance Coon, MD 25 mL/hr at 10/07/17 1000 8 mg/hr at 10/07/17 1000  . [START ON 10/10/2017] pantoprazole (PROTONIX) injection 40 mg  40 mg Intravenous Laurence Spates, MD      . rifaximin Doreene Nest) tablet 550 mg  550 mg Per Tube BID Wilhelmina Mcardle, MD   550 mg at 10/07/17 1307  . sennosides (SENOKOT) 8.8 MG/5ML syrup 5 mL  5 mL Per Tube BID PRN Tukov, Magadalene S, NP      . sodium bicarbonate 150 mEq in dextrose 5 % 1,000 mL infusion   Intravenous Continuous Tukov,  Magadalene S, NP 150 mL/hr at 10/07/17 1308    . thiamine 546m in normal saline (582m IVPB  500 mg Intravenous Q24H SiWilhelmina McardleMD      . vancomycin (VANCOCIN) 1,250 mg in sodium chloride 0.9 % 250 mL IVPB  1,250 mg Intravenous Q24H TuMikael SprayNP   Stopped at 10/07/17 0817  . vasopressin (PITRESSIN) 40 Units in sodium chloride 0.9 % 250 mL (0.16 Units/mL) infusion  0.03 Units/min Intravenous Continuous SiWilhelmina McardleMD 11.3 mL/hr at 10/07/17 1245 0.03 Units/min at 10/07/17 1245      Allergies: Not on File    Past Medical History: Past Medical History:  Diagnosis Date  . Alcoholism ([employer]     Past Surgical History: History reviewed. No pertinent surgical history.   Family History: History reviewed. No pertinent family history.   Social History: Social History   Socioeconomic History  . Marital status: Married    Spouse name: Not on file  . Number of children: Not on file  . Years of education: Not on file  . Highest education level: Not on file  Occupational History  . Not on file  Social Needs  . Financial resource strain: Not on file  . Food insecurity:    Worry: Not on file    Inability: Not on file  . Transportation needs:    Medical: Not on file    Non-medical: Not on file  Tobacco Use  . Smoking status: Unknown If Ever Smoked  . Smokeless tobacco: Never Used  Substance and Sexual Activity  . Alcohol use: Yes  . Drug use: Yes  . Sexual activity: Yes  Lifestyle  . Physical activity:    Days per week: Not on file    Minutes per session: Not on file  . Stress: Not on file  Relationships  . Social connections:    Talks on phone: Not on file    Gets together: Not on file    Attends religious service: Not on file    Active member of club or organization: Not on file    Attends meetings of clubs or organizations: Not on file    Relationship status: Not on file  . Intimate partner violence:    Fear of current or ex partner: Not on  file    Emotionally abused: Not on  file    Physically abused: Not on file    Forced sexual activity: Not on file  Other Topics Concern  . Not on file  Social History Narrative  . Not on file     Review of Systems: Review of Systems  Unable to perform ROS: Intubated    Vital Signs: Blood pressure (!) 138/48, pulse (!) 122, temperature (!) 101.3 F (38.5 C), resp. rate 16, height '5\' 5"'  (1.651 m), weight 83.1 kg (183 lb 3.2 oz), SpO2 96 %.  Weight trends: Filed Weights   09/30/2017 1955 10/07/17 0004 10/07/17 0113  Weight: 77.1 kg (170 lb) 83.1 kg (183 lb 3.2 oz) 83.1 kg (183 lb 3.2 oz)    Physical Exam: General: Critically ill  Head: ETT, OGT  Eyes: Anicteric  Neck: RIJ triple lumen catheter  Lungs:  Pressure support  Heart: Regular rate and rhythm  Abdomen:  Soft, nontender,   Extremities:  ++ peripheral edema.  Neurologic: Intubated and sedate  Skin: No lesions  Access: none     Lab results: Basic Metabolic Panel: Recent Labs  Lab 09/26/2017 2025 10/07/17 0501  NA 137 141  K 3.7 3.5  CL 104 110  CO2 13* 15*  GLUCOSE 98 66  BUN 34* 32*  CREATININE 1.38* 1.75*  CALCIUM 8.2* 6.8*    Liver Function Tests: Recent Labs  Lab 10/03/2017 2025  AST 208*  ALT 69*  ALKPHOS 78  BILITOT 10.7*  PROT 7.1  ALBUMIN 2.4*   Recent Labs  Lab 09/22/2017 2025 10/07/17 0501  LIPASE 46  --   AMYLASE  --  139*   Recent Labs  Lab 10/07/17 0157  AMMONIA 112*    CBC: Recent Labs  Lab 09/20/2017 2025 10/07/17 0501  WBC 11.3* 12.4*  NEUTROABS 9.2*  --   HGB 12.8 10.2*  HCT 38.0 30.5*  MCV 119.0* 118.8*  PLT 112* 89*    Cardiac Enzymes: Recent Labs  Lab 09/25/2017 2025 10/07/17 0142 10/07/17 0501 10/07/17 1300  CKTOTAL  --   --  993*  --   TROPONINI 0.45* 0.54* 0.67* 1.24*    BNP: Invalid input(s): POCBNP  CBG: Recent Labs  Lab 10/07/17 0041 10/07/17 0307 10/07/17 0600 10/07/17 0812 10/07/17 1327  GLUCAP 54* 93 79 76 1     Microbiology: Results for orders placed or performed during the hospital encounter of 09/16/2017  Culture, blood (routine x 2)     Status: None (Preliminary result)   Collection Time: 09/25/2017  8:25 PM  Result Value Ref Range Status   Specimen Description BLOOD LT WRIST  Final   Special Requests   Final    BOTTLES DRAWN AEROBIC AND ANAEROBIC Blood Culture adequate volume   Culture  Setup Time Organism ID to follow  Final   Culture   Final    NO GROWTH < 12 HOURS Performed at [facility], [address] [place], [place] [address]    Report Status PENDING  Incomplete  Culture, blood (routine x 2)     Status: None (Preliminary result)   Collection Time: 09/26/2017  8:25 PM  Result Value Ref Range Status   Specimen Description BLOOD LEFT ANTECUBITAL  Final   Special Requests   Final    BOTTLES DRAWN AEROBIC AND ANAEROBIC Blood Culture results may not be optimal due to an excessive volume of blood received in culture bottles   Culture   Final    NO GROWTH < 12 HOURS Performed at [facility], [address] [place], [place] [address]    Report Status PENDING  Incomplete  Blood Culture ID Panel (Reflexed)     Status: Abnormal   Collection Time: 09/17/2017  8:25 PM  Result Value Ref Range Status   Enterococcus species NOT DETECTED NOT DETECTED Final   Listeria monocytogenes NOT DETECTED NOT DETECTED Final   Staphylococcus species DETECTED (A) NOT DETECTED Final    Comment: CRITICAL RESULT CALLED TO, READ BACK BY AND VERIFIED WITH:  NATE COOKSON AT 1740 10/07/17 SDR    Staphylococcus aureus DETECTED (A) NOT DETECTED Final    Comment: Methicillin (oxacillin) susceptible Staphylococcus aureus (MSSA). Preferred therapy is anti staphylococcal beta lactam antibiotic (Cefazolin or Nafcillin), unless clinically contraindicated. CRITICAL RESULT CALLED TO, READ BACK BY AND VERIFIED WITH:  NATE COOKSON AT 8144 10/07/17 SDR    Methicillin resistance NOT DETECTED NOT DETECTED  Final   Streptococcus species NOT DETECTED NOT DETECTED Final   Streptococcus agalactiae NOT DETECTED NOT DETECTED Final   Streptococcus pneumoniae NOT DETECTED NOT DETECTED Final   Streptococcus pyogenes NOT DETECTED NOT DETECTED Final   Acinetobacter baumannii NOT DETECTED NOT DETECTED Final   Enterobacteriaceae species NOT DETECTED NOT DETECTED Final   Enterobacter cloacae complex NOT DETECTED NOT DETECTED Final   Escherichia coli NOT DETECTED NOT DETECTED Final   Klebsiella oxytoca NOT DETECTED NOT DETECTED Final   Klebsiella pneumoniae NOT DETECTED NOT DETECTED Final   Proteus species NOT DETECTED NOT DETECTED Final   Serratia marcescens NOT DETECTED NOT DETECTED Final   Haemophilus influenzae NOT DETECTED NOT DETECTED Final   Neisseria meningitidis NOT DETECTED NOT DETECTED Final   Pseudomonas aeruginosa NOT DETECTED NOT DETECTED Final   Candida albicans NOT DETECTED NOT DETECTED Final   Candida glabrata NOT DETECTED NOT DETECTED Final   Candida krusei NOT DETECTED NOT DETECTED Final   Candida parapsilosis NOT DETECTED NOT DETECTED Final   Candida tropicalis NOT DETECTED NOT DETECTED Final    Comment: Performed at [facility], [address] [place], [place] [address]  MRSA PCR Screening     Status: None   Collection Time: 10/07/17  1:00 PM  Result Value Ref Range Status   MRSA by PCR NEGATIVE NEGATIVE Final    Comment:        The GeneXpert MRSA Assay (FDA approved for NASAL specimens only), is one component of a comprehensive MRSA colonization surveillance program. It is not intended to diagnose MRSA infection nor to guide or monitor treatment for MRSA infections. Performed at [facility], [address] [place], [place] [address]     Coagulation Studies: Recent Labs    09/25/2017 2025  LABPROT 36.2*  INR 3.67    Urinalysis: Recent Labs    10/05/2017 2010  COLORURINE AMBER*  LABSPEC 1.016  PHURINE 5.0  GLUCOSEU 50*  HGBUR LARGE*   BILIRUBINUR MODERATE*  KETONESUR NEGATIVE  PROTEINUR 100*  NITRITE POSITIVE*  LEUKOCYTESUR NEGATIVE      Imaging: Ct Head Wo Contrast  Result Date: 09/17/2017 CLINICAL DATA:  Altered LOC EXAM: CT HEAD WITHOUT CONTRAST TECHNIQUE: Contiguous axial images were obtained from the base of the skull through the vertex without intravenous contrast. COMPARISON:  None. FINDINGS: Brain: No acute territorial infarction, hemorrhage or intracranial mass is visualized. Mild atrophy. Mild small vessel ischemic changes of the white matter. Ventricles are nonenlarged Vascular: No hyperdense vessels. Scattered calcifications at the carotid siphons. Skull: Fluid in the bilateral mastoid air cells.  No fracture Sinuses/Orbits: Mucosal thickening in the ethmoid and maxillary sinuses.  No acute orbital abnormality Other: None IMPRESSION: 1. No CT evidence for acute intracranial abnormality. 2. Atrophy and mild small vessel ischemic changes of the white matter 3. Bilateral mastoid effusion Electronically Signed   By: Donavan Foil M.D.   On: 10/01/2017 21:28   Dg Chest Port 1 View  Result Date: 10/07/2017 CLINICAL DATA:  Central line placement EXAM: PORTABLE CHEST 1 VIEW COMPARISON:  And chest radiograph 09/22/2017 FINDINGS: Right IJ approach central venous catheter tip is at the cavoatrial junction. No pneumothorax. The lungs and the remainder of the support apparatus are unchanged. Endotracheal tube tip remains just above the carina and should be retracted by 4 cm. IMPRESSION: 1. Right IJ central venous catheter tip at the cavoatrial junction. No pneumothorax. 2. Endotracheal tube tip just above the opening to the right mainstem bronchus. Recommend retraction by 4 cm. 3. Unchanged appearance of bilateral airspace disease. Electronically Signed   By: Ulyses Jarred M.D.   On: 10/07/2017 04:53   Dg Chest Portable 1 View  Result Date: 10/13/2017 CLINICAL DATA:  Altered mental status EXAM: PORTABLE CHEST 1 VIEW COMPARISON:   None. FINDINGS: Endotracheal tube tip is near the orifice for right mainstem bronchus. Esophageal tube tip is below the diaphragm but non included. Cardiomegaly with vascular congestion. Low lung volumes. Multifocal airspace disease in the upper lobes and left base. Aortic atherosclerosis. Low right paratracheal opacity, possible vascular shadow or node. No pneumothorax IMPRESSION: 1. Endotracheal tube tip near the orifice for right mainstem bronchus 2. Esophageal tube tip below the diaphragm but non included 3. Multifocal airspace disease in the upper lobes and left base concerning for multifocal pneumonia 4. Cardiomegaly with vascular congestion. Right paratracheal opacity, prominent vascular shadow versus nodes. Electronically Signed   By: Donavan Foil M.D.   On: 09/25/2017 20:53   Dg Abd Portable 1 View  Result Date: 09/23/2017 CLINICAL DATA:  OG placement EXAM: PORTABLE ABDOMEN - 1 VIEW COMPARISON:  None. FINDINGS: Esophageal tube tip overlies the proximal stomach. Mild diffuse gaseous dilatation of small bowel measuring up to 3.3 cm. IMPRESSION: 1. Esophageal tube tip projects over the proximal stomach 2. Mild diffuse gaseous distention of small bowel, possible ileus Electronically Signed   By: [place] Foil M.D.   On: 10/04/2017 20:54      Assessment & Plan: Whitney Olson is a 57 y.o. white female with history of alcohol abuse, who was admitted to [facility] on 10/07/2017 for Acute respiratory failure, unspecified whether with hypoxia or hypercapnia ([place]) [J96.00] Altered mental status, unspecified altered mental status type [R41.82]  1. Acute renal failure with metabolic acidosis: no known baseline creatinine. Creatinine 1.38 to 1.75 with anuric urine output.  Urinalysis with hematuria, proteinuria and glucosuria - Concerning for progressive renal failure. Most likely will need renal replacement therapy. Recommend starting continous renal replacement therapy. Discussed case with critical  care.   2. Anemia with thrombocytopenia: trending downward.   3. Sepsis: with MSSA on 4/22 blood cultures.  Empiric cefepime. Will discontinue vancomycin - Appreciate ID input.   LOS: 1 Raydin Bielinski 4/23/20193:16 PM

## 2017-10-07 NOTE — Progress Notes (Signed)
Pharmacy Antibiotic Note  Whitney Olson is a 57 y.o. female admitted on 2018-04-26 with pneumonia.  Pharmacy has been consulted for vanc/zosyn dosing.  Plan: Patient received vanc 1g and zosyn 3.375g IV x 1  Will continue w/ vanc 1.25g IV q24h w/ 6 hour stack Will draw vanc trough 04/26 @ 0530 prior to 4th dose. Will continue zosyn 3.375g IV q8h  Ke 0.0324 T1/2 24 hrs Goal trough 15 - 20 mcg/mL  Height: 5\' 5"  (165.1 cm) Weight: 183 lb 3.2 oz (83.1 kg) IBW/kg (Calculated) : 57  Temp (24hrs), Avg:96 F (35.6 C), Min:94.9 F (34.9 C), Max:97.2 F (36.2 C)  Recent Labs  Lab 2017-10-04 2025 2017-10-04 2254 10/07/17 0142 10/07/17 0501  WBC 11.3*  --   --  12.4*  CREATININE 1.38*  --   --  1.75*  LATICACIDVEN 11.3* 8.9* 10.7* 10.7*    Estimated Creatinine Clearance: 38.6 mL/min (A) (by C-G formula based on SCr of 1.75 mg/dL (H)).    Not on File  Thank you for allowing pharmacy to be a part of this patient's care.  Thomasene Rippleavid Evelynne Spiers, PharmD, BCPS Clinical Pharmacist 10/07/2017

## 2017-10-07 NOTE — Progress Notes (Signed)
Spoke with Sonda Rumbleana Blakeney, NP and made her aware of patient's most recent troponin of 1.24 and that patient just had dark maroon stool bleeding from rectum small in amount.  NP acknowledged giving no new orders at this time.

## 2017-10-07 NOTE — Progress Notes (Addendum)
Initial Nutrition Assessment  DOCUMENTATION CODES:   Not applicable  INTERVENTION:   At this time, pt is not hemodynamically stable for EN feeding.  When pt is hemodynamically stable, recommend Vital High Protein @ 25 mL/hr. This provides 600 kcal, 52.5 grams protein, and 504 mL water.  Add pro-stat QID for an additional 60 gm protein to provide a total of 112.5 gm protein/day. Pro-stat QID contributes 400 kcal and 5% dextrose contributes 612 kcal for a total of 1612 kcal/day.  Recommend liquid MVI for pt to meet 100% RDI's  NUTRITION DIAGNOSIS:   Inadequate oral intake related to inability to eat as evidenced by NPO status(ventilated).  GOAL:   Provide needs based on ASPEN/SCCM guidelines  MONITOR:   Vent status, I & O's, Labs, Weight trends  REASON FOR ASSESSMENT:   Ventilator   ASSESSMENT:  57 y.o. female with no known PMH (possible hx of alcohol abuse) who presents after being found at home unresponsive and obtunded.  Per EMS, patient was cyanotic, had an extremely low blood glucose level. Admitted with acute respiratory failure with hypoxia, sepsis, PNA, UTI.   Patient intubated and sedated.    Noted pt's husband is also admitted in hospital.  Access: 18 Fr. OGT placed 4/22; terminates in stomach per abdominal x-ray 4/22; 60 cm at corner of mouth  MAP: 55-79 mmHg since midnight 4/23  Pressors: Norepinephrine 20 mcg/min, Vasopressin 0.03 units/min  Patient is currently intubated on ventilator support Ve: 10 L/min Temp(24 hrs): Min: 94.9 degrees F , Max 100 degrees F (37.8 degrees C)  Propofol: N/A  Medications reviewed and include: aspirin, lactulose, pantoprazole, cefepime, rifaximin, famotidine, fentanyl, midazolam, norepinephrine 20 mcg/min, pantoprazole, sodium bicarb in dextrose 5% (providing 612 kcal), vancomycin, vasopressin 40 units, folic acid 1 mg, thiamine 500 mg  Labs reviewed: CBG 98, 66 past 24 hours, K 3.5 wnl, BUN 32(H), Creatinine 1.75(H),  GFR 32(L), RBC 2.56(L), Hemoglobin 10.2(L), HCT 30.5(L), MCV 118.8(H), MCH 39.9(H), RDW 16.9(H)  I/O: In:2.2L; Out:5715mL urine  Weight trend: no weight history in pt chart  Discussed with RN and on rounds.  NUTRITION - FOCUSED PHYSICAL EXAM:   Most Recent Value  Orbital Region  No depletion  Upper Arm Region  No depletion  Thoracic and Lumbar Region  Unable to assess  Buccal Region  No depletion  Temple Region  No depletion  Clavicle Bone Region  No depletion  Clavicle and Acromion Bone Region  No depletion  Scapular Bone Region  Unable to assess  Dorsal Hand  No depletion  Patellar Region  No depletion  Anterior Thigh Region  No depletion  Posterior Calf Region  No depletion  Edema (RD Assessment)  Moderate [right foot]  Hair  Reviewed  Eyes  Unable to assess  Mouth  Unable to assess  Skin  Reviewed [face and neck appeared jaundiced]  Nails  Reviewed     Diet Order:  No diet orders on file  EDUCATION NEEDS:   No education needs have been identified at this time  Skin:  Skin Assessment: Reviewed RN Assessment  Last BM:  10/07/17(per nurse)  Height:   Ht Readings from Last 1 Encounters:  10/07/17 5\' 5"  (1.651 m)   Weight:   Wt Readings from Last 1 Encounters:  10/07/17 183 lb 3.2 oz (83.1 kg)    Ideal Body Weight:  56.8 kg  BMI:  Body mass index is 30.49 kg/m.  Estimated Nutritional Needs:   Kcal:  [phone] kcals (11-14 kcal/kg ABW)  Protein:  114  g/kg/day (2 g/kg IBW)  Fluid:  1.7-2.0 L/day (IBW x 30-35)  Clydene Pugh, MS Dietetic Intern [phone]

## 2017-10-07 NOTE — Progress Notes (Signed)
[facility] [place] [facility]   PATIENT NAME: Whitney Olson    MR#:  [id number]  DATE OF BIRTH:  [birth year]  SUBJECTIVE:  CHIEF COMPLAINT: Intubated  REVIEW OF SYSTEMS:  Review of system unobtainable as the patient is intubated  DRUG ALLERGIES:  Not on File  VITALS:  Blood pressure (!) 135/53, pulse (!) 122, temperature (!) 101.7 F (38.7 C), resp. rate 16, height 5\' 5"  (1.651 m), weight 83.1 kg (183 lb 3.2 oz), SpO2 93 %.  PHYSICAL EXAMINATION:  GENERAL:  57 y.o.-year-old patient lying in the bed with no acute distress.  EYES: Pupils equal, round, reactive to light and accommodation. No scleral icterus.  HEENT: Head atraumatic, normocephalic. Oropharynx  with ET tube.  NECK:  Supple, no jugular venous distention. No thyroid enlargement, no tenderness.  LUNGS: Diminished coarse breath sounds bilaterally, positive rhonchi CARDIOVASCULAR: S1, S2 normal. No murmurs, rubs, or gallops.  ABDOMEN: Soft, nontender, nondistended. Bowel sounds present. No organomegaly or mass.  EXTREMITIES: No pedal edema, cyanosis, or clubbing.  NEUROLOGIC: Sedated PSYCHIATRIC: Patient is sedated SKIN: No obvious rash, lesion, or ulcer.    LABORATORY PANEL:   CBC Recent Labs  Lab 10/07/17 0501  WBC 12.4*  HGB 10.2*  HCT 30.5*  PLT 89*   ------------------------------------------------------------------------------------------------------------------  Chemistries  Recent Labs  Lab 10/13/2017 2025 10/07/17 0501  NA 137 141  K 3.7 3.5  CL 104 110  CO2 13* 15*  GLUCOSE 98 66  BUN 34* 32*  CREATININE 1.38* 1.75*  CALCIUM 8.2* 6.8*  AST 208*  --   ALT 69*  --   ALKPHOS 78  --   BILITOT 10.7*  --    ------------------------------------------------------------------------------------------------------------------  Cardiac Enzymes Recent Labs  Lab 10/07/17 1300  TROPONINI 1.24*    ------------------------------------------------------------------------------------------------------------------  RADIOLOGY:  Ct Head Wo Contrast  Result Date: 10/14/2017 CLINICAL DATA:  Altered LOC EXAM: CT HEAD WITHOUT CONTRAST TECHNIQUE: Contiguous axial images were obtained from the base of the skull through the vertex without intravenous contrast. COMPARISON:  None. FINDINGS: Brain: No acute territorial infarction, hemorrhage or intracranial mass is visualized. Mild atrophy. Mild small vessel ischemic changes of the white matter. Ventricles are nonenlarged Vascular: No hyperdense vessels. Scattered calcifications at the carotid siphons. Skull: Fluid in the bilateral mastoid air cells.  No fracture Sinuses/Orbits: Mucosal thickening in the ethmoid and maxillary sinuses. No acute orbital abnormality Other: None IMPRESSION: 1. No CT evidence for acute intracranial abnormality. 2. Atrophy and mild small vessel ischemic changes of the white matter 3. Bilateral mastoid effusion Electronically Signed   By: Jasmine Pang M.D.   On: 10/09/2017 21:28   Dg Chest Port 1 View  Result Date: 10/07/2017 CLINICAL DATA:  Central line placement EXAM: PORTABLE CHEST 1 VIEW COMPARISON:  And chest radiograph 10/10/2017 FINDINGS: Right IJ approach central venous catheter tip is at the cavoatrial junction. No pneumothorax. The lungs and the remainder of the support apparatus are unchanged. Endotracheal tube tip remains just above the carina and should be retracted by 4 cm. IMPRESSION: 1. Right IJ central venous catheter tip at the cavoatrial junction. No pneumothorax. 2. Endotracheal tube tip just above the opening to the right mainstem bronchus. Recommend retraction by 4 cm. 3. Unchanged appearance of bilateral airspace disease. Electronically Signed   By: Deatra Robinson M.D.   On: 10/07/2017 04:53   Dg Chest Portable 1 View  Result Date: 10/05/2017 CLINICAL DATA:  Altered mental status EXAM: PORTABLE CHEST 1 VIEW  COMPARISON:  None. FINDINGS:  Endotracheal tube tip is near the orifice for right mainstem bronchus. Esophageal tube tip is below the diaphragm but non included. Cardiomegaly with vascular congestion. Low lung volumes. Multifocal airspace disease in the upper lobes and left base. Aortic atherosclerosis. Low right paratracheal opacity, possible vascular shadow or node. No pneumothorax IMPRESSION: 1. Endotracheal tube tip near the orifice for right mainstem bronchus 2. Esophageal tube tip below the diaphragm but non included 3. Multifocal airspace disease in the upper lobes and left base concerning for multifocal pneumonia 4. Cardiomegaly with vascular congestion. Right paratracheal opacity, prominent vascular shadow versus nodes. Electronically Signed   By: Jasmine Pang M.D.   On: 09/19/2017 20:53   Dg Abd Portable 1 View  Result Date: 10/03/2017 CLINICAL DATA:  OG placement EXAM: PORTABLE ABDOMEN - 1 VIEW COMPARISON:  None. FINDINGS: Esophageal tube tip overlies the proximal stomach. Mild diffuse gaseous dilatation of small bowel measuring up to 3.3 cm. IMPRESSION: 1. Esophageal tube tip projects over the proximal stomach 2. Mild diffuse gaseous distention of small bowel, possible ileus Electronically Signed   By: Jasmine Pang M.D.   On: 10/14/2017 20:54    EKG:   Orders placed or performed during the hospital encounter of 09/29/2017  . EKG 12-Lead  . EKG 12-Lead    ASSESSMENT AND PLAN:    Acute respiratory failure with hypoxia (HCC) -likely related to her pneumonia as below, that is unclear exactly why she collapsed.  Unclear if she perhaps developed pneumonia or aspiration first and then collapsed, or if she aspirated after she had collapsed.  Patient is intubated and sedated and mechanically ventilated.  #   Severe sepsis (HCC) -due to her pneumonia, MSSA bacteremia  lactic acid extremely elevated-10.7  IV fluids and will trend lactate until within normal limits  IV antibiotics initially on  Zosyn and vancomycin changed to IV Unasyn which would cover aspiration pneumonia as well as MSSA ID following Echocardiogram ordered if it is negative would consider TEE  #  Multifocal pneumonia -unclear if this is primary infection or perhaps due to an aspiration.   She is intubated with IV antibiotics as above, other supportive treatment PRN  #  Alcohol withdrawal (HCC) -patient's ethanol level was undetectable here in the ED today, is very likely that she has started to withdraw.  This is perhaps the original etiology for her malaise and collapse with potential aspiration at that point.   Patient is currently sedated with fentanyl and Versed drip which we will treat her withdrawal, when she is able to be extubated she will need continued CIWA protocol    #UTI -nitrite positive UA, IV antibiotics as above, culture pending  #  Elevated troponin -likely related to demand ischemia from stress from her sepsis and respiratory failure.  Trend cardiac enzymes    # Coagulopathy (HCC) -INR is 3.67.  Probably from alcoholic liver disease  She is not on any anticoagulants  Will avoid hepatotoxins and monitor her INR     All the records are reviewed and case discussed with Care Management/Social Workerr. Management plans discussed with the friend at bedside, son Molli Hazard is a healthcare power of attorney on his way to hospital   CODE STATUS: fc   TOTAL TIME TAKING CARE OF THIS PATIENT: 33  minutes.   POSSIBLE D/C IN  ? DAYS, DEPENDING ON CLINICAL CONDITION.  Note: This dictation was prepared with Dragon dictation along with smaller phrase technology. Any transcriptional errors that result from this process are unintentional.  Ramonita LabAruna Siddiq Kaluzny M.D on 10/07/2017 at 4:42 PM  Between 7am to 6pm - Pager - [phone] After 6pm go to [email or web address] - password [email or web address]  [facility] [place] [facility]  Office  [phone]  CC: Primary care physician; Patient, No Pcp Per

## 2017-10-08 ENCOUNTER — Inpatient Hospital Stay: Payer: BC Managed Care – PPO

## 2017-10-08 DIAGNOSIS — Z7189 Other specified counseling: Secondary | ICD-10-CM

## 2017-10-08 LAB — CBC
HCT: 34.1 % — ABNORMAL LOW (ref 35.0–47.0)
HEMOGLOBIN: 10.8 g/dL — AB (ref 12.0–16.0)
MCH: 39.6 pg — AB (ref 26.0–34.0)
MCHC: 31.7 g/dL — AB (ref 32.0–36.0)
MCV: 124.8 fL — ABNORMAL HIGH (ref 80.0–100.0)
PLATELETS: 82 10*3/uL — AB (ref 150–440)
RBC: 2.74 MIL/uL — ABNORMAL LOW (ref 3.80–5.20)
RDW: 18.2 % — AB (ref 11.5–14.5)
WBC: 16 10*3/uL — ABNORMAL HIGH (ref 3.6–11.0)

## 2017-10-08 LAB — HEPATIC FUNCTION PANEL
ALBUMIN: 1.8 g/dL — AB (ref 3.5–5.0)
ALK PHOS: 89 U/L (ref 38–126)
ALT: 1342 U/L — ABNORMAL HIGH (ref 14–54)
AST: 9794 U/L — ABNORMAL HIGH (ref 15–41)
BILIRUBIN INDIRECT: 3.9 mg/dL — AB (ref 0.3–0.9)
BILIRUBIN TOTAL: 8.8 mg/dL — AB (ref 0.3–1.2)
Bilirubin, Direct: 4.9 mg/dL — ABNORMAL HIGH (ref 0.1–0.5)
Total Protein: 5.4 g/dL — ABNORMAL LOW (ref 6.5–8.1)

## 2017-10-08 LAB — BASIC METABOLIC PANEL
Anion gap: 13 (ref 5–15)
BUN: 30 mg/dL — AB (ref 6–20)
CALCIUM: 6.6 mg/dL — AB (ref 8.9–10.3)
CHLORIDE: 104 mmol/L (ref 101–111)
CO2: 22 mmol/L (ref 22–32)
CREATININE: 2.43 mg/dL — AB (ref 0.44–1.00)
GFR, EST AFRICAN AMERICAN: 25 mL/min — AB (ref >60)
GFR, EST NON AFRICAN AMERICAN: 21 mL/min — AB (ref >60)
Glucose, Bld: 133 mg/dL — ABNORMAL HIGH (ref 65–99)
Potassium: 4.1 mmol/L (ref 3.5–5.1)
SODIUM: 139 mmol/L (ref 135–145)

## 2017-10-08 LAB — GLUCOSE, CAPILLARY
GLUCOSE-CAPILLARY: 108 mg/dL — AB (ref 65–99)
Glucose-Capillary: 102 mg/dL — ABNORMAL HIGH (ref 65–99)
Glucose-Capillary: 101 mg/dL — ABNORMAL HIGH (ref 65–99)
Glucose-Capillary: 93 mg/dL (ref 65–99)
Glucose-Capillary: 75 mg/dL (ref 65–99)

## 2017-10-08 LAB — FIBRINOGEN: FIBRINOGEN: 132 mg/dL — AB (ref 210–475)

## 2017-10-08 LAB — TYPE AND SCREEN
ABO/RH(D): O POS
Antibody Screen: NEGATIVE
Unit division: 0

## 2017-10-08 LAB — BLOOD GAS, ARTERIAL
ACID-BASE EXCESS: UNDETERMINED mmol/L (ref 0.0–2.0)
BICARBONATE: UNDETERMINED mmol/L (ref 20.0–28.0)
FIO2: 0.4
O2 Saturation: UNDETERMINED %
PATIENT TEMPERATURE: 37
PCO2 ART: 101 mmHg — AB (ref 32.0–48.0)
PEEP/CPAP: 5 cmH2O
RATE: 14 resp/min
pO2, Arterial: <31 mmHg — CL (ref 83.0–108.0)

## 2017-10-08 LAB — PROTIME-INR
INR: 11.2
PROTHROMBIN TIME: 86.5 s — AB (ref 11.4–15.2)

## 2017-10-08 LAB — MAGNESIUM
MAGNESIUM: 2.3 mg/dL (ref 1.7–2.4)
Magnesium: 2 mg/dL (ref 1.7–2.4)

## 2017-10-08 LAB — RENAL FUNCTION PANEL
ALBUMIN: 1.7 g/dL — AB (ref 3.5–5.0)
ALBUMIN: 1.7 g/dL — AB (ref 3.5–5.0)
ANION GAP: 13 (ref 5–15)
Anion gap: 15 (ref 5–15)
BUN: 30 mg/dL — ABNORMAL HIGH (ref 6–20)
BUN: 26 mg/dL — ABNORMAL HIGH (ref 6–20)
CALCIUM: 6.4 mg/dL — AB (ref 8.9–10.3)
CHLORIDE: 105 mmol/L (ref 101–111)
CO2: 20 mmol/L — ABNORMAL LOW (ref 22–32)
CO2: 23 mmol/L (ref 22–32)
CREATININE: 2.31 mg/dL — AB (ref 0.44–1.00)
Calcium: 6.5 mg/dL — ABNORMAL LOW (ref 8.9–10.3)
Chloride: 103 mmol/L (ref 101–111)
Creatinine, Ser: 2.46 mg/dL — ABNORMAL HIGH (ref 0.44–1.00)
GFR calc non Af Amer: 23 mL/min — ABNORMAL LOW (ref >60)
GFR, EST AFRICAN AMERICAN: 24 mL/min — AB (ref >60)
GFR, EST AFRICAN AMERICAN: 26 mL/min — AB (ref >60)
GFR, EST NON AFRICAN AMERICAN: 21 mL/min — AB (ref >60)
GLUCOSE: 129 mg/dL — AB (ref 65–99)
Glucose, Bld: 127 mg/dL — ABNORMAL HIGH (ref 65–99)
PHOSPHORUS: 8.8 mg/dL — AB (ref 2.5–4.6)
PHOSPHORUS: 8.2 mg/dL — AB (ref 2.5–4.6)
POTASSIUM: 4.1 mmol/L (ref 3.5–5.1)
Potassium: 4.3 mmol/L (ref 3.5–5.1)
SODIUM: 139 mmol/L (ref 135–145)
Sodium: 140 mmol/L (ref 135–145)

## 2017-10-08 LAB — ABO/RH: ABO/RH(D): O POS

## 2017-10-08 LAB — URINE CULTURE: CULTURE: NO GROWTH

## 2017-10-08 LAB — BPAM RBC
Blood Product Expiration Date: 201905162359
Unit Type and Rh: 5100

## 2017-10-08 LAB — PREPARE RBC (CROSSMATCH)

## 2017-10-08 LAB — PROCALCITONIN: PROCALCITONIN: 0.8 ng/mL

## 2017-10-08 LAB — APTT
APTT: 58 s — AB (ref 24–36)
aPTT: 66 seconds — ABNORMAL HIGH (ref 24–36)

## 2017-10-08 LAB — HIV ANTIBODY (ROUTINE TESTING W REFLEX): HIV Screen 4th Generation wRfx: NONREACTIVE

## 2017-10-08 MED ORDER — SODIUM CHLORIDE 0.9 % IV SOLN
0.0000 ug/min | INTRAVENOUS | Status: DC
  Administered 2017-10-08: 300 ug/min via INTRAVENOUS
  Filled 2017-10-08: qty 40
  Filled 2017-10-08: qty 4

## 2017-10-08 MED ORDER — AMIODARONE HCL IN DEXTROSE 360-4.14 MG/200ML-% IV SOLN
60.0000 mg/h | INTRAVENOUS | Status: AC
Start: 2017-10-08 — End: 2017-10-08
  Administered 2017-10-08: 60 mg/h via INTRAVENOUS
  Filled 2017-10-08: qty 200

## 2017-10-08 MED ORDER — VITAMIN K1 10 MG/ML IJ SOLN: 10.0000 mg | Freq: Once | INTRAMUSCULAR | Status: DC

## 2017-10-08 MED ORDER — SODIUM CHLORIDE 0.9 % IV SOLN
1.0000 g | Freq: Once | INTRAVENOUS | Status: AC
  Administered 2017-10-08: 1 g via INTRAVENOUS
  Filled 2017-10-08: qty 10

## 2017-10-08 MED ORDER — AMIODARONE IV BOLUS ONLY 150 MG/100ML
INTRAVENOUS | Status: AC
  Administered 2017-10-08: 150 mg via INTRAVENOUS
  Filled 2017-10-08: qty 100

## 2017-10-08 MED ORDER — AMIODARONE HCL IN DEXTROSE 360-4.14 MG/200ML-% IV SOLN: 30.0000 mg/h | INTRAVENOUS | Status: DC

## 2017-10-08 MED ORDER — AMIODARONE IV BOLUS ONLY 150 MG/100ML
150.0000 mg | Freq: Once | INTRAVENOUS | Status: AC
  Administered 2017-10-08: 150 mg via INTRAVENOUS

## 2017-10-08 MED ORDER — SODIUM CHLORIDE 0.9 % IV SOLN: Freq: Once | INTRAVENOUS | Status: DC

## 2017-10-08 MED ORDER — SODIUM CHLORIDE 0.9 % IV SOLN
Freq: Once | INTRAVENOUS | Status: AC
  Administered 2017-10-08: 07:00:00 via INTRAVENOUS

## 2017-10-08 MED ORDER — VITAMIN K1 10 MG/ML IJ SOLN
5.0000 mg | Freq: Once | INTRAVENOUS | Status: AC
  Administered 2017-10-08: 5 mg via INTRAVENOUS
  Filled 2017-10-08: qty 0.5

## 2017-10-08 MED FILL — Medication: Qty: 1 | Status: AC

## 2017-10-09 LAB — CULTURE, BLOOD (ROUTINE X 2): Special Requests: ADEQUATE

## 2017-10-09 LAB — BPAM FFP
Blood Product Expiration Date: 201904292359
Blood Product Expiration Date: 201904292359
ISSUE DATE / TIME: 201904240648
ISSUE DATE / TIME: 201904240739
Unit Type and Rh: 5100
Unit Type and Rh: 9500

## 2017-10-09 LAB — PREPARE FRESH FROZEN PLASMA
UNIT DIVISION: 0
Unit division: 0

## 2017-10-09 LAB — HEAVY METALS, BLOOD
Arsenic: 7 ug/L (ref 2–23)
Lead: 2 ug/dL (ref 0–4)
Mercury: NOT DETECTED ug/L (ref 0.0–14.9)

## 2017-10-10 LAB — CULTURE, RESPIRATORY

## 2017-10-12 LAB — CULTURE, BLOOD (SINGLE): Culture: NO GROWTH

## 2017-10-13 ENCOUNTER — Telehealth: Payer: Self-pay | Admitting: Pulmonary Disease

## 2017-10-13 NOTE — Telephone Encounter (Signed)
Death certificate placed in MD folder for completion.ss

## 2017-10-13 NOTE — Telephone Encounter (Signed)
[employer] dropped off Death Certificate to be completed Placed in Nurse box

## 2017-10-14 NOTE — Telephone Encounter (Signed)
Informed [facility] death cert is ready for pick up.

## 2017-10-15 NOTE — Progress Notes (Signed)
Dr. Sung AmabileSimonds came to bedside along with Annabelle Harmanana, NP.  Dr. Sung AmabileSimonds stated that we would not code the patient considering she is maxed out on full support.  Multiple staff have attempted to get in touch with her son multiple times with no answer on the phone.

## 2017-10-15 NOTE — Progress Notes (Signed)
Patient was on full support and maxed out on all vasopressors and bradied down to asystole on cardiac monitor.  No pulse dopplered.  Dr. Sung AmabileSimonds at bedside and pronounced patient's time of death at [id number].  Patient's step daughter at bedside.

## 2017-10-15 NOTE — Progress Notes (Signed)
Patients wedding band set removed from patients left ring finger (gold in color band with circle diamond and band) sent to locker with Engineer, materialssecurity officer. Key placed in chart.

## 2017-10-15 NOTE — Progress Notes (Signed)
Progressive deterioration over night with all therapies maxed out. I updated husband who and informed him that there is nothing further that can be add. We made pt DNR in event of cardiac arrest. Shortly thereafter, she suffered progressive bradycardia > asystole.   Pulseless, unresponsive, flat-line on monitor, no spontaneous movement. Pronounce dead @ 0842  Billy Fischeravid Ezequias Lard, MD PCCM service Mobile [phone] Pager [phone] 10/13/2017 8:44 AM

## 2017-10-15 NOTE — Progress Notes (Signed)
Patients son at bedside and very tearful and visibly upset asking questions.  RN explained to son what happened and asked Dr. Sung AmabileSimonds to come talk with him.  RN offered support to son.  Luz BrazenChaplain Pete came to be with son.
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
## 2017-10-15 NOTE — Progress Notes (Signed)
Patient's husband at bedside after his nurse from [facility] brought him to visit patient.  Husband very tearful and spoke with Dr. Sung AmabileSimonds at bedside.  Patient made DNR.  Step daughter with patient's husband.

## 2017-10-15 NOTE — Progress Notes (Addendum)
Pt had cardiac rhythm change switching between afibb with rvr and svt heart rate 190's-200, therefore amiodarone bolus and amiodarone gtt initiated.  Pt also hypotensive with systolic bp 50's added neo-synephrine gtt, administered 3 amps of sodium bicarb, increased vasopressin, and levophed gtts.  Following interventions systolic bp 90's and heart rate 140's. EKG and ABG pending. Pt to receive 4 units of FFP and 1 unit pRBC's due to elevated INR and intermittent active bleeding. Attempted to contact pts son Rise PaganiniMatthew Mills via telephone, however he did not answer the phone and unable to leave message.  Will continue to monitor and assess pt.   Sonda Rumbleana Blakeney, AGNP  Pulmonary/Critical Care Pager [phone] (please enter 7 digits) PCCM Consult Pager [phone] (please enter 7 digits)   Evaluated patient this morning with AGNP Blakeney.  Patient deteriorated with atrial fibrillation and then PSVT.  She never recovered from this.  Her prognosis was already very poor to begin with and with refractory shock, it became evident that she could not survive this illness.  We got her husband to the bedside ([facility]) and he agreed to forego ACLS measures in the event of cardiac arrest.  This was per conversation with me.  The patient did suffer subsequent cardiac arrest and passed away peacefully.  Billy Fischeravid Simonds, MD PCCM service Mobile [phone] Pager [phone] 10/11/2017 4:44 PM

## 2017-10-15 NOTE — Progress Notes (Signed)
RN called and spoke with Annice PihJackie, RN-AC and made her aware of patient's time of death and Annice PihJackie, RN stated she would notify [facility]S.

## 2017-10-15 NOTE — Progress Notes (Signed)
1350/12-09-17  Contacted by Ebony Hailick Edinger ME, not ME case

## 2017-10-15 DEATH — deceased

## 2017-11-05 ENCOUNTER — Telehealth: Payer: Self-pay | Admitting: Pulmonary Disease

## 2017-11-05 NOTE — Telephone Encounter (Signed)
Patients son dropped off [employer] forms to be completed  Placed in inter-office mail and sent to [facility] Please contact Rise Paganini with any questions at [phone]

## 2019-11-01 IMAGING — DX DG CHEST 1V PORT
1 series · 1 of 1 positions shown · non-contrast
Comparison: None.

CLINICAL DATA: Altered mental status

EXAM:
PORTABLE CHEST 1 VIEW

[chest ap]
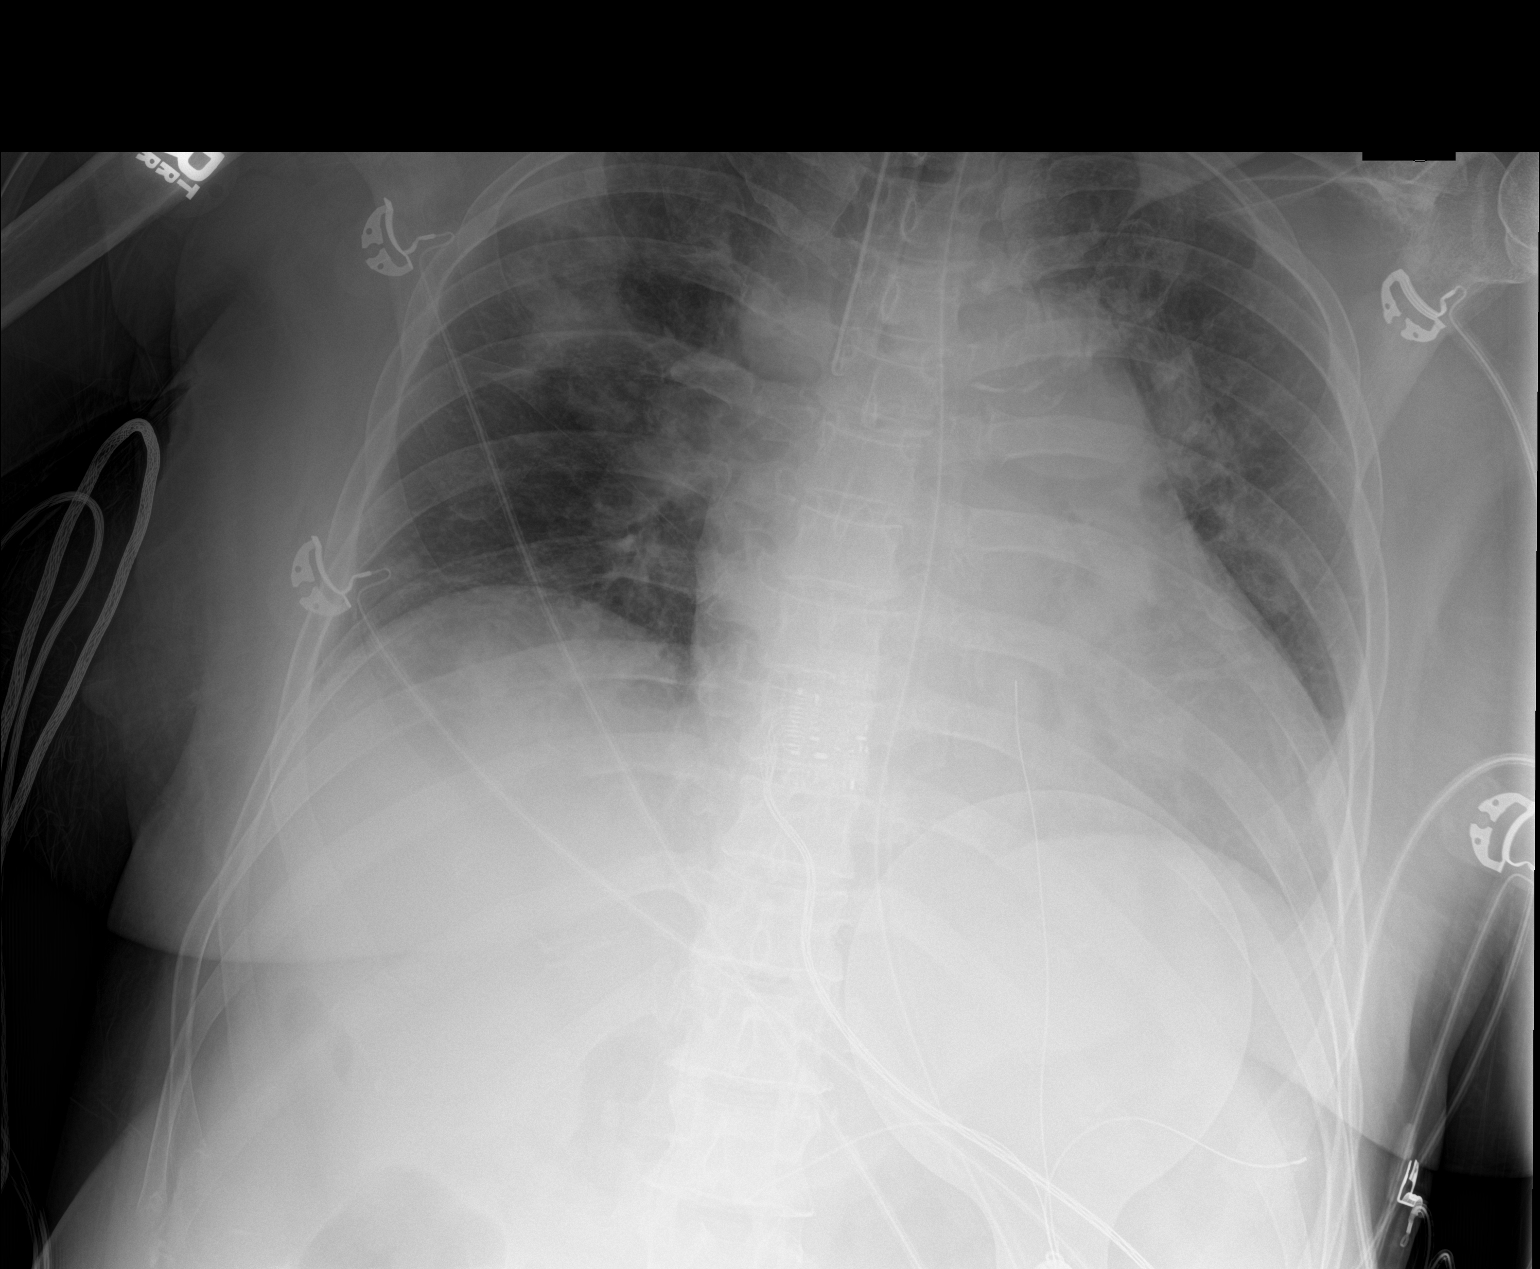

[1 of 1 positions shown; findings below may reference images not displayed]

FINDINGS: Endotracheal tube tip is near the orifice for right mainstem
bronchus. Esophageal tube tip is below the diaphragm but non
included. Cardiomegaly with vascular congestion. Low lung volumes.
Multifocal airspace disease in the upper lobes and left base. Aortic
atherosclerosis. Low right paratracheal opacity, possible vascular
shadow or node. No pneumothorax
IMPRESSION: 1. Endotracheal tube tip near the orifice for right mainstem
bronchus
2. Esophageal tube tip below the diaphragm but non included
3. Multifocal airspace disease in the upper lobes and left base
concerning for multifocal pneumonia
4. Cardiomegaly with vascular congestion. Right paratracheal
opacity, prominent vascular shadow versus nodes.

## 2019-11-02 IMAGING — DX DG CHEST 1V PORT
1 series · 1 of 1 positions shown · non-contrast
Comparison: Film from earlier in the same day.

CLINICAL DATA: Central line placement

EXAM:
PORTABLE CHEST 1 VIEW

[chest ap]
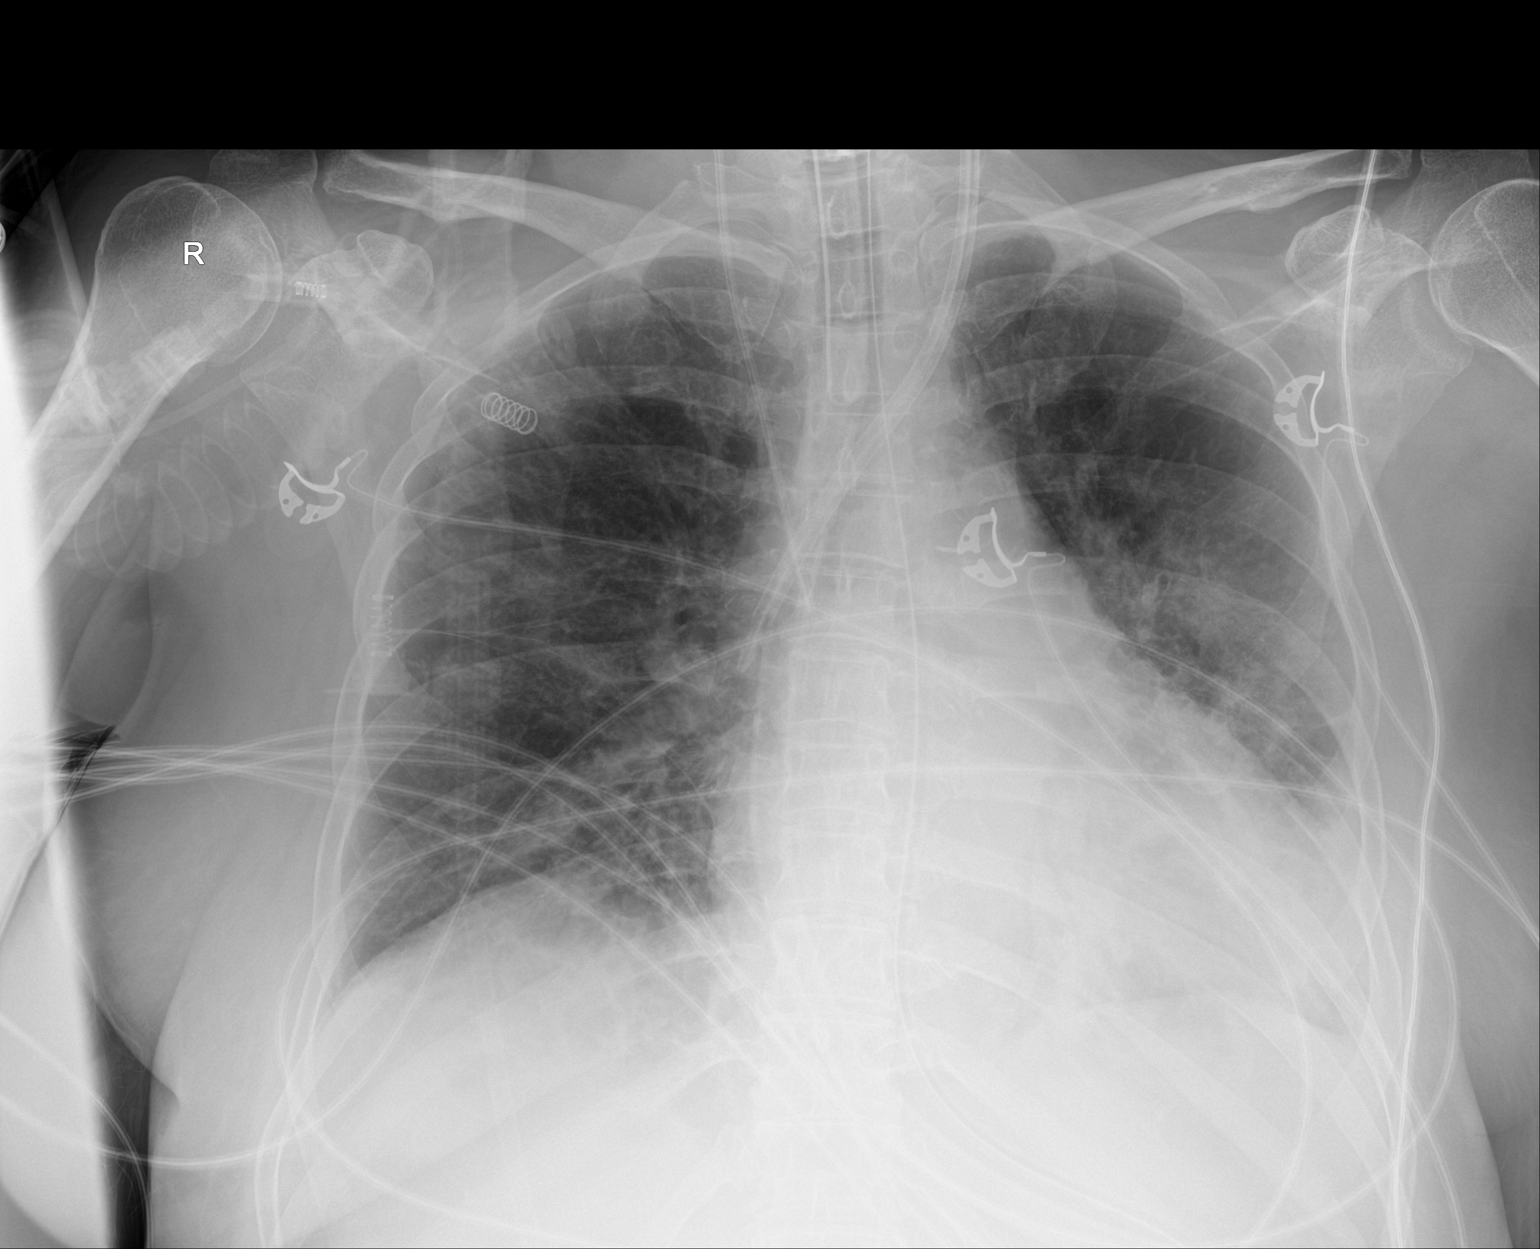

[1 of 1 positions shown; findings below may reference images not displayed]

FINDINGS: Endotracheal tube, nasogastric catheter and right jugular central
line are again seen. New left jugular temporary dialysis catheter is
noted with the catheter tip in the mid superior vena cava. No
pneumothorax is noted. Cardiac shadow is stable. Lungs are well
aerated bilaterally with patchy airspace disease similar to that
seen on the prior exam.
IMPRESSION: No pneumothorax following left jugular central line placement.

Remainder of the exam is unchanged aside from endotracheal
repositioning.
